# Patient Record
Sex: Male | Born: 1965 | Race: White | Hispanic: No | Smoking: Former smoker
Health system: Southern US, Community
[De-identification: ages and names within clinical notes are randomized; demographics above are authoritative.]

## PROBLEM LIST (undated history)

## (undated) DIAGNOSIS — K760 Fatty (change of) liver, not elsewhere classified: Secondary | ICD-10-CM

## (undated) DIAGNOSIS — L408 Other psoriasis: Secondary | ICD-10-CM

## (undated) DIAGNOSIS — I10 Essential (primary) hypertension: Secondary | ICD-10-CM

## (undated) DIAGNOSIS — R079 Chest pain, unspecified: Secondary | ICD-10-CM

## (undated) DIAGNOSIS — G43909 Migraine, unspecified, not intractable, without status migrainosus: Secondary | ICD-10-CM

## (undated) DIAGNOSIS — R7303 Prediabetes: Secondary | ICD-10-CM

## (undated) DIAGNOSIS — K219 Gastro-esophageal reflux disease without esophagitis: Secondary | ICD-10-CM

## (undated) DIAGNOSIS — K5792 Diverticulitis of intestine, part unspecified, without perforation or abscess without bleeding: Secondary | ICD-10-CM

## (undated) DIAGNOSIS — M545 Low back pain: Secondary | ICD-10-CM

## (undated) DIAGNOSIS — F988 Other specified behavioral and emotional disorders with onset usually occurring in childhood and adolescence: Secondary | ICD-10-CM

## (undated) DIAGNOSIS — G4730 Sleep apnea, unspecified: Secondary | ICD-10-CM

## (undated) DIAGNOSIS — E785 Hyperlipidemia, unspecified: Secondary | ICD-10-CM

## (undated) DIAGNOSIS — F32A Depression, unspecified: Secondary | ICD-10-CM

## (undated) DIAGNOSIS — M549 Dorsalgia, unspecified: Secondary | ICD-10-CM

## (undated) DIAGNOSIS — J309 Allergic rhinitis, unspecified: Secondary | ICD-10-CM

## (undated) DIAGNOSIS — F329 Major depressive disorder, single episode, unspecified: Secondary | ICD-10-CM

## (undated) HISTORY — DX: Prediabetes: R73.03

## (undated) HISTORY — DX: Depression, unspecified: F32.A

## (undated) HISTORY — DX: Dorsalgia, unspecified: M54.9

## (undated) HISTORY — DX: Migraine, unspecified, not intractable, without status migrainosus: G43.909

## (undated) HISTORY — DX: Essential (primary) hypertension: I10

## (undated) HISTORY — DX: Major depressive disorder, single episode, unspecified: F32.9

## (undated) HISTORY — DX: Gastro-esophageal reflux disease without esophagitis: K21.9

## (undated) HISTORY — DX: Fatty (change of) liver, not elsewhere classified: K76.0

## (undated) HISTORY — DX: Other psoriasis: L40.8

## (undated) HISTORY — DX: Low back pain: M54.5

## (undated) HISTORY — DX: Chest pain, unspecified: R07.9

## (undated) HISTORY — PX: CHOLECYSTECTOMY: SHX55

## (undated) HISTORY — DX: Sleep apnea, unspecified: G47.30

## (undated) HISTORY — DX: Hyperlipidemia, unspecified: E78.5

## (undated) HISTORY — DX: Allergic rhinitis, unspecified: J30.9

## (undated) HISTORY — DX: Morbid (severe) obesity due to excess calories: E66.01

## (undated) HISTORY — DX: Other specified behavioral and emotional disorders with onset usually occurring in childhood and adolescence: F98.8

---

## 1998-09-14 ENCOUNTER — Encounter: Payer: Self-pay | Admitting: Family Medicine

## 1998-09-14 ENCOUNTER — Emergency Department (HOSPITAL_COMMUNITY): Admission: EM | Admit: 1998-09-14 | Discharge: 1998-09-14 | Payer: Self-pay | Admitting: Family Medicine

## 2004-10-08 ENCOUNTER — Ambulatory Visit: Payer: Self-pay | Admitting: Internal Medicine

## 2004-10-08 LAB — CONVERTED CEMR LAB: PSA: 0.46 ng/mL

## 2004-10-15 ENCOUNTER — Ambulatory Visit: Payer: Self-pay | Admitting: Internal Medicine

## 2005-02-25 ENCOUNTER — Ambulatory Visit: Payer: Self-pay | Admitting: Internal Medicine

## 2006-11-13 ENCOUNTER — Ambulatory Visit: Payer: Self-pay | Admitting: Internal Medicine

## 2007-01-08 ENCOUNTER — Ambulatory Visit: Payer: Self-pay | Admitting: Internal Medicine

## 2007-01-08 DIAGNOSIS — M545 Low back pain, unspecified: Secondary | ICD-10-CM

## 2007-01-08 DIAGNOSIS — F329 Major depressive disorder, single episode, unspecified: Secondary | ICD-10-CM

## 2007-01-08 DIAGNOSIS — K219 Gastro-esophageal reflux disease without esophagitis: Secondary | ICD-10-CM

## 2007-01-08 DIAGNOSIS — L408 Other psoriasis: Secondary | ICD-10-CM

## 2007-01-08 DIAGNOSIS — J309 Allergic rhinitis, unspecified: Secondary | ICD-10-CM

## 2007-01-08 DIAGNOSIS — E785 Hyperlipidemia, unspecified: Secondary | ICD-10-CM

## 2007-01-08 DIAGNOSIS — G43909 Migraine, unspecified, not intractable, without status migrainosus: Secondary | ICD-10-CM

## 2007-01-08 DIAGNOSIS — F3289 Other specified depressive episodes: Secondary | ICD-10-CM

## 2007-01-08 HISTORY — DX: Low back pain, unspecified: M54.50

## 2007-01-08 HISTORY — DX: Major depressive disorder, single episode, unspecified: F32.9

## 2007-01-08 HISTORY — DX: Allergic rhinitis, unspecified: J30.9

## 2007-01-08 HISTORY — DX: Hyperlipidemia, unspecified: E78.5

## 2007-01-08 HISTORY — DX: Other specified depressive episodes: F32.89

## 2007-01-08 HISTORY — DX: Gastro-esophageal reflux disease without esophagitis: K21.9

## 2007-01-08 HISTORY — DX: Other psoriasis: L40.8

## 2007-01-08 HISTORY — DX: Migraine, unspecified, not intractable, without status migrainosus: G43.909

## 2007-01-08 HISTORY — DX: Morbid (severe) obesity due to excess calories: E66.01

## 2007-02-21 ENCOUNTER — Encounter: Payer: Self-pay | Admitting: Internal Medicine

## 2007-02-21 ENCOUNTER — Ambulatory Visit: Payer: Self-pay | Admitting: Internal Medicine

## 2007-10-18 ENCOUNTER — Ambulatory Visit: Payer: Self-pay | Admitting: Internal Medicine

## 2007-10-18 DIAGNOSIS — M549 Dorsalgia, unspecified: Secondary | ICD-10-CM

## 2007-10-18 HISTORY — DX: Dorsalgia, unspecified: M54.9

## 2007-11-20 ENCOUNTER — Ambulatory Visit: Payer: Self-pay | Admitting: Internal Medicine

## 2008-04-08 ENCOUNTER — Encounter: Payer: Self-pay | Admitting: Internal Medicine

## 2008-04-09 ENCOUNTER — Encounter: Payer: Self-pay | Admitting: Internal Medicine

## 2008-04-16 ENCOUNTER — Ambulatory Visit: Payer: Self-pay | Admitting: Internal Medicine

## 2008-04-16 DIAGNOSIS — R079 Chest pain, unspecified: Secondary | ICD-10-CM

## 2008-04-16 HISTORY — DX: Chest pain, unspecified: R07.9

## 2008-04-21 ENCOUNTER — Encounter: Payer: Self-pay | Admitting: Internal Medicine

## 2008-05-06 ENCOUNTER — Encounter: Payer: Self-pay | Admitting: Internal Medicine

## 2008-09-01 ENCOUNTER — Ambulatory Visit: Payer: Self-pay | Admitting: Diagnostic Radiology

## 2008-09-01 ENCOUNTER — Emergency Department (HOSPITAL_BASED_OUTPATIENT_CLINIC_OR_DEPARTMENT_OTHER): Admission: EM | Admit: 2008-09-01 | Discharge: 2008-09-02 | Payer: Self-pay | Admitting: Emergency Medicine

## 2011-02-13 ENCOUNTER — Encounter: Payer: Self-pay | Admitting: Internal Medicine

## 2011-02-13 DIAGNOSIS — Z0000 Encounter for general adult medical examination without abnormal findings: Secondary | ICD-10-CM | POA: Insufficient documentation

## 2011-02-18 ENCOUNTER — Encounter: Payer: Self-pay | Admitting: Internal Medicine

## 2011-02-18 ENCOUNTER — Ambulatory Visit (INDEPENDENT_AMBULATORY_CARE_PROVIDER_SITE_OTHER): Payer: BC Managed Care – PPO | Admitting: Internal Medicine

## 2011-02-18 VITALS — BP 104/70 | HR 68 | Temp 98.9°F | Ht 68.0 in | Wt 233.8 lb

## 2011-02-18 DIAGNOSIS — K219 Gastro-esophageal reflux disease without esophagitis: Secondary | ICD-10-CM

## 2011-02-18 DIAGNOSIS — J309 Allergic rhinitis, unspecified: Secondary | ICD-10-CM

## 2011-02-18 DIAGNOSIS — Z23 Encounter for immunization: Secondary | ICD-10-CM

## 2011-02-18 DIAGNOSIS — H9311 Tinnitus, right ear: Secondary | ICD-10-CM | POA: Insufficient documentation

## 2011-02-18 DIAGNOSIS — Z0000 Encounter for general adult medical examination without abnormal findings: Secondary | ICD-10-CM

## 2011-02-18 DIAGNOSIS — F329 Major depressive disorder, single episode, unspecified: Secondary | ICD-10-CM

## 2011-02-18 DIAGNOSIS — H9319 Tinnitus, unspecified ear: Secondary | ICD-10-CM

## 2011-02-18 MED ORDER — FLUTICASONE PROPIONATE 50 MCG/ACT NA SUSP: 2.0000 | Freq: Every day | NASAL | Status: DC

## 2011-02-18 MED ORDER — FEXOFENADINE HCL 180 MG PO TABS: 180.0000 mg | ORAL_TABLET | Freq: Every day | ORAL | Status: DC

## 2011-02-18 NOTE — Progress Notes (Signed)
  Subjective:    Patient ID: Terry Warren, male    DOB: 10-30-1965, 45 y.o.   MRN: [id number]  HPI  Here to c/o new onset ringing to right ear since sept 15 only in [place]virons with loud noises occuring, better with cotton in the ear to prevent sound exposure.  Does have several wks ongoing nasal allergy symptoms with clear congestion, itch and sneeze, without fever, pain, ST, cough or wheezing.  Requested Tdap due to recent vacation an small cut from oyster shells, now resolved. Pt denies chest pain, increased sob or doe, wheezing, orthopnea, PND, increased LE swelling, palpitations, dizziness or syncope.  Pt denies new neurological symptoms such as new headache, or facial or extremity weakness or numbness   Pt denies polydipsia, polyuria,  Pt denies fever, wt loss, night sweats, loss of appetite, or other constitutional symptoms  Does not feel "ill"  Denies worsening reflux, dysphagia, abd pain, n/v, bowel change or blood.  Denies worsening depressive symptoms, suicidal ideation, or panic, though has ongoing anxiety  Past Medical History  Diagnosis Date  . ALLERGIC RHINITIS 01/08/2007  . BACK PAIN 10/18/2007  . CHEST PAIN 04/16/2008  . DEPRESSION 01/08/2007  . GERD 01/08/2007  . HYPERLIPIDEMIA 01/08/2007  . LOW BACK PAIN 01/08/2007  . MIGRAINE HEADACHE 01/08/2007  . OBESITY, MORBID 01/08/2007  . PSORIASIS 01/08/2007   No past surgical history on file.  reports that he has never smoked. He does not have any smokeless tobacco history on file. He reports that he does not drink alcohol or use illicit drugs. family history includes Cancer in his other. Allergies  Allergen Reactions  . Penicillins    Current Outpatient Prescriptions on File Prior to Visit  Medication Sig Dispense Refill  . esomeprazole (NEXIUM) 40 MG capsule Take 40 mg by mouth daily.         Review of Systems Review of Systems  Constitutional: Negative for diaphoresis and unexpected weight change.  HENT: Negative for drooling and  tinnitus.   Eyes: Negative for photophobia and visual disturbance.  Respiratory: Negative for choking and stridor.   Gastrointestinal: Negative for vomiting and blood in stool.  Genitourinary: Negative for hematuria and decreased urine volume.       Objective:   Physical Exam BP 104/70  Pulse 68  Temp(Src) 98.9 F (37.2 C) (Oral)  Ht 5\' 8"  (1.727 m)  Wt 233 lb 12 oz (106.028 kg)  BMI 35.54 kg/m2  SpO2 96% Physical Exam  VS noted, not ill appearing Constitutional: Pt appears well-developed and well-nourished.  HENT: Head: Normocephalic.  Right Ear: External ear normal.  Left Ear: External ear normal. Bilat tm's mild erythema.  Sinus nontender.  Pharynx mild erythema  Eyes: Conjunctivae and EOM are normal. Pupils are equal, round, and reactive to light.  Neck: Normal range of motion. Neck supple.  Cardiovascular: Normal rate and regular rhythm.   Pulmonary/Chest: Effort normal and breath sounds normal. \ Abd: soft, +BS, nondistended, no HSM, NT Neurological: Pt is alert. No cranial nerve deficit. motor/sens/dtr/gait intact Skin: Skin is warm. No erythema. no rash Psychiatric: Pt behavior is normal. Thought content normal. 1+ nervous, not depressed appaering        Assessment & Plan:

## 2011-02-18 NOTE — Patient Instructions (Addendum)
Take all new medications as prescribed Continue all other medications as before You had the tetanus shot today Please return in 1 year for your yearly visit, or sooner if needed, with Lab testing done 3-5 days before

## 2011-02-19 ENCOUNTER — Encounter: Payer: Self-pay | Admitting: Internal Medicine

## 2011-02-19 NOTE — Assessment & Plan Note (Signed)
stable overall by hx and exam, most recent data reviewed with pt, and pt to continue medical treatment as before  Lab Results  Component Value Date   PSA 0.46 10/08/2004

## 2011-02-19 NOTE — Assessment & Plan Note (Signed)
Mild to mod, for allegra/flonase asd,  to f/u any worsening symptoms or concerns  

## 2011-02-19 NOTE — Assessment & Plan Note (Signed)
Most likely related to recent allergy flare, declines depomedrol today, also for mucinex otc prn,  to f/u any worsening symptoms or concerns, consider ENT

## 2011-02-19 NOTE — Assessment & Plan Note (Signed)
Improved and stable, now controlled with diet and zantac 75 mg daily,  to f/u any worsening symptoms or concerns

## 2011-06-28 ENCOUNTER — Encounter: Payer: Self-pay | Admitting: *Deleted

## 2011-06-28 ENCOUNTER — Emergency Department
Admission: EM | Admit: 2011-06-28 | Discharge: 2011-06-28 | Disposition: A | Discharge status: Home / Self Care | Payer: BC Managed Care – PPO | Source: Home / Self Care | Attending: Emergency Medicine | Admitting: Emergency Medicine

## 2011-06-28 DIAGNOSIS — J069 Acute upper respiratory infection, unspecified: Secondary | ICD-10-CM

## 2011-06-28 MED ORDER — AZITHROMYCIN 250 MG PO TABS: ORAL_TABLET | ORAL | Status: AC

## 2011-06-28 NOTE — ED Provider Notes (Signed)
History     CSN: [id number]  Arrival date & time 06/28/11  1253   First MD Initiated Contact with Patient 06/28/11 1311      No chief complaint on file.   (Consider location/radiation/quality/duration/timing/severity/associated sxs/prior treatment) HPI Terry Warren is a 46 y.o. male who complains of onset of cold symptoms for 3 days.  No sore throat + cough No pleuritic pain No wheezing + nasal congestion +post-nasal drainage + sinus pain/pressure No chest congestion No itchy/red eyes No earache No hemoptysis No SOB No chills/sweats No fever + nausea No vomiting No abdominal pain No diarrhea No skin rashes No fatigue No myalgias + headache    Past Medical History  Diagnosis Date  . ALLERGIC RHINITIS 01/08/2007  . BACK PAIN 10/18/2007  . CHEST PAIN 04/16/2008  . DEPRESSION 01/08/2007  . GERD 01/08/2007  . HYPERLIPIDEMIA 01/08/2007  . LOW BACK PAIN 01/08/2007  . MIGRAINE HEADACHE 01/08/2007  . OBESITY, MORBID 01/08/2007  . PSORIASIS 01/08/2007    No past surgical history on file.  Family History  Problem Relation Age of Onset  . Cancer Other     breast cancer    History  Substance Use Topics  . Smoking status: Never Smoker   . Smokeless tobacco: Not on file  . Alcohol Use: No      Review of Systems  Allergies  Penicillins  Home Medications   Current Outpatient Rx  Name Route Sig Dispense Refill  . FEXOFENADINE HCL 180 MG PO TABS Oral Take 1 tablet (180 mg total) by mouth daily. 30 tablet 2  . FLUTICASONE PROPIONATE 50 MCG/ACT NA SUSP Nasal Place 2 sprays into the nose daily. 16 g 2  . RANITIDINE HCL 25 MG PO TBEF Oral Take 25 mg by mouth daily.        There were no vitals taken for this visit.  Physical Exam  Nursing note and vitals reviewed. Constitutional: He is oriented to person, place, and time. He appears well-developed and well-nourished.  HENT:  Head: Normocephalic and atraumatic.  Right Ear: Tympanic membrane, external ear and ear canal  normal.  Left Ear: Tympanic membrane, external ear and ear canal normal.  Nose: Mucosal edema and rhinorrhea present.  Mouth/Throat: Posterior oropharyngeal erythema present. No oropharyngeal exudate or posterior oropharyngeal edema.  Eyes: No scleral icterus.  Neck: Neck supple.  Cardiovascular: Regular rhythm and normal heart sounds.   Pulmonary/Chest: Effort normal and breath sounds normal. No respiratory distress.  Neurological: He is alert and oriented to person, place, and time.  Skin: Skin is warm and dry.  Psychiatric: He has a normal mood and affect. His speech is normal.    ED Course  Procedures (including critical care time)  Labs Reviewed - No data to display No results found.   No diagnosis found.    MDM  1)  Take the prescribed antibiotic as instructed. 2)  Use nasal saline solution (over the counter) at least 3 times a day. 3)  Use over the counter decongestants like Zyrtec-D every 12 hours as needed to help with congestion.  If you have hypertension, do not take medicines with sudafed.  4)  Can take tylenol every 6 hours or motrin every 8 hours for pain or fever. 5)  Follow up with your primary doctor if no improvement in 5-7 days, sooner if increasing pain, fever, or new symptoms.     Lily Kocher, MD 06/28/11 [phone]

## 2011-06-28 NOTE — ED Notes (Signed)
Pt c/o sinus pressure, HA, nausea, and chills x 2 days.

## 2012-02-15 ENCOUNTER — Encounter: Payer: Self-pay | Admitting: *Deleted

## 2012-02-15 ENCOUNTER — Emergency Department
Admission: EM | Admit: 2012-02-15 | Discharge: 2012-02-15 | Disposition: A | Discharge status: Home / Self Care | Payer: BC Managed Care – PPO | Source: Home / Self Care

## 2012-02-15 DIAGNOSIS — H9319 Tinnitus, unspecified ear: Secondary | ICD-10-CM

## 2012-02-15 DIAGNOSIS — H9311 Tinnitus, right ear: Secondary | ICD-10-CM

## 2012-02-15 DIAGNOSIS — J309 Allergic rhinitis, unspecified: Secondary | ICD-10-CM

## 2012-02-15 MED ORDER — FEXOFENADINE HCL 180 MG PO TABS: 180.0000 mg | ORAL_TABLET | Freq: Every day | ORAL | Status: DC

## 2012-02-15 MED ORDER — FLUTICASONE PROPIONATE 50 MCG/ACT NA SUSP: 2.0000 | Freq: Every day | NASAL | Status: DC

## 2012-02-15 NOTE — ED Notes (Signed)
Pt c/o RT ear ache and tinnitus x 6 days. No OTC meds. Denies fever.

## 2012-02-15 NOTE — ED Provider Notes (Signed)
History     CSN: [id number]  Arrival date & time 02/15/12  1215   First MD Initiated Contact with Patient 02/15/12 1221      Chief Complaint  Patient presents with  . Otalgia   HPI L ear irritation x 1-2 weeks. Pt states that he initially noticed irritation during/after  football game. Has had intermittent ear irritation and ringing since this point. Pt states that he went [place] the [employer] game over the weekend and ear irritation and pain flared again. Pt denies any decreased hearing, ear pain, nausea, vomiting. No recent infections. Pt states that he had a similar episode last year associated with going to football game. Pt states that he was placed on flonase and allegra and sxs resolved.  Pt does report hx/o allergic rhinitis, though sxs have been mild.   Past Medical History  Diagnosis Date  . ALLERGIC RHINITIS 01/08/2007  . BACK PAIN 10/18/2007  . CHEST PAIN 04/16/2008  . DEPRESSION 01/08/2007  . GERD 01/08/2007  . HYPERLIPIDEMIA 01/08/2007  . LOW BACK PAIN 01/08/2007  . MIGRAINE HEADACHE 01/08/2007  . OBESITY, MORBID 01/08/2007  . PSORIASIS 01/08/2007    Past Surgical History  Procedure Date  . Cholecystectomy     Family History  Problem Relation Age of Onset  . Cancer Other     breast cancer  . Heart failure Mother   . Cancer Mother     breast  . Cancer Father     prostate  . Heart failure Father     History  Substance Use Topics  . Smoking status: Former Games developer  . Smokeless tobacco: Not on file  . Alcohol Use: No      Review of Systems  All other systems reviewed and are negative.    Allergies  Penicillins  Home Medications   Current Outpatient Rx  Name Route Sig Dispense Refill  . FEXOFENADINE HCL 180 MG PO TABS Oral Take 1 tablet (180 mg total) by mouth daily. 30 tablet 2  . FLUTICASONE PROPIONATE 50 MCG/ACT NA SUSP Nasal Place 2 sprays into the nose daily. 16 g 2  . RANITIDINE HCL 25 MG PO TBEF Oral Take 25 mg by mouth daily.        BP  127/89  Pulse 81  Temp 98.5 F (36.9 C) (Oral)  Resp 18  Ht 5\' 8"  (1.727 m)  Wt 242 lb (109.77 kg)  BMI 36.80 kg/m2  SpO2 97%  Physical Exam  Constitutional: He appears well-developed and well-nourished.  HENT:  Head: Normocephalic and atraumatic.  Right Ear: External ear normal.  Left Ear: External ear normal.       +mild nasal erythema, rhinorrhea bilaterally, + post oropharyngeal erythema    Eyes: Conjunctivae normal are normal. Pupils are equal, round, and reactive to light.  Neck: Normal range of motion. Neck supple.  Cardiovascular: Normal rate, regular rhythm and normal heart sounds.   Pulmonary/Chest: Effort normal and breath sounds normal. He has no wheezes.  Abdominal: Soft. Bowel sounds are normal.  Musculoskeletal: Normal range of motion.  Lymphadenopathy:    He has no cervical adenopathy.  Neurological: He is alert.  Skin: Skin is warm.    ED Course  Procedures (including critical care time)  Labs Reviewed - No data to display No results found.   1. Allergic rhinitis, cause unspecified   2. Tinnitus of right ear       MDM  Will place back on flonase and allegra.  Discussed general symptomatic management.  No infectious/neuro red flags currently.  Discussed follow up with ENT if this persists.     The patient and/or caregiver has been counseled thoroughly with regard to treatment plan and/or medications prescribed including dosage, schedule, interactions, rationale for use, and possible side effects and they verbalize understanding. Diagnoses and expected course of recovery discussed and will return if not improved as expected or if the condition worsens. Patient and/or caregiver verbalized understanding.             Doree Albee, MD 02/15/12 1343

## 2012-02-20 ENCOUNTER — Ambulatory Visit (HOSPITAL_BASED_OUTPATIENT_CLINIC_OR_DEPARTMENT_OTHER)
Admission: RE | Admit: 2012-02-20 | Discharge: 2012-02-20 | Disposition: A | Discharge status: Home / Self Care | Payer: BC Managed Care – PPO | Source: Ambulatory Visit | Attending: Family Medicine | Admitting: Family Medicine

## 2012-02-20 ENCOUNTER — Encounter: Payer: Self-pay | Admitting: *Deleted

## 2012-02-20 ENCOUNTER — Emergency Department
Admission: EM | Admit: 2012-02-20 | Discharge: 2012-02-20 | Disposition: A | Discharge status: Home / Self Care | Payer: BC Managed Care – PPO | Source: Home / Self Care

## 2012-02-20 ENCOUNTER — Telehealth: Payer: Self-pay | Admitting: *Deleted

## 2012-02-20 DIAGNOSIS — R1032 Left lower quadrant pain: Secondary | ICD-10-CM | POA: Insufficient documentation

## 2012-02-20 DIAGNOSIS — K5732 Diverticulitis of large intestine without perforation or abscess without bleeding: Secondary | ICD-10-CM | POA: Insufficient documentation

## 2012-02-20 DIAGNOSIS — K7689 Other specified diseases of liver: Secondary | ICD-10-CM | POA: Insufficient documentation

## 2012-02-20 DIAGNOSIS — K5730 Diverticulosis of large intestine without perforation or abscess without bleeding: Secondary | ICD-10-CM | POA: Insufficient documentation

## 2012-02-20 DIAGNOSIS — K4090 Unilateral inguinal hernia, without obstruction or gangrene, not specified as recurrent: Secondary | ICD-10-CM | POA: Insufficient documentation

## 2012-02-20 DIAGNOSIS — K5792 Diverticulitis of intestine, part unspecified, without perforation or abscess without bleeding: Secondary | ICD-10-CM

## 2012-02-20 DIAGNOSIS — K5900 Constipation, unspecified: Secondary | ICD-10-CM | POA: Insufficient documentation

## 2012-02-20 LAB — POCT CBC W AUTO DIFF (K'VILLE URGENT CARE)

## 2012-02-20 MED ORDER — IOHEXOL 300 MG/ML  SOLN
100.0000 mL | Freq: Once | INTRAMUSCULAR | Status: AC | PRN
  Administered 2012-02-20: 100 mL via INTRAVENOUS

## 2012-02-20 MED ORDER — CIPROFLOXACIN HCL 500 MG PO TABS: 500.0000 mg | ORAL_TABLET | Freq: Two times a day (BID) | ORAL | Status: DC

## 2012-02-20 MED ORDER — METRONIDAZOLE 500 MG PO TABS: 500.0000 mg | ORAL_TABLET | Freq: Three times a day (TID) | ORAL | Status: DC

## 2012-02-20 NOTE — ED Notes (Signed)
Pt c/o lower abdominal and HA x this morning. No OTC meds. Denies fever.

## 2012-02-20 NOTE — ED Provider Notes (Signed)
History     CSN: [id number]  Arrival date & time 02/20/12  1225   First MD Initiated Contact with Patient 02/20/12 1226      Chief Complaint  Patient presents with  . Abdominal Pain  . Headache   HPI Comments: LLQ abd pain x 1 day Pt woke up this am with abd pain that persisted with eating.  Has also had some generalized malaise.  No fevers or chills.  Pt has hx/o diverticulitis. Last flare was several years ago.  Pain seems to be progressively worsening.   Patient is a 46 y.o. male presenting with abdominal pain and headaches.  Abdominal Pain The primary symptoms of the illness include abdominal pain. Episode onset: earlier today  The onset of the illness was gradual. The problem has been gradually worsening.  The illness is associated with eating. The patient states that she believes she is currently not pregnant. The patient has not had a change in bowel habit (pt denies eating seeds or nuts ). Significant associated medical issues include diverticulitis.  Headache The primary symptoms include headaches.    Past Medical History  Diagnosis Date  . ALLERGIC RHINITIS 01/08/2007  . BACK PAIN 10/18/2007  . CHEST PAIN 04/16/2008  . DEPRESSION 01/08/2007  . GERD 01/08/2007  . HYPERLIPIDEMIA 01/08/2007  . LOW BACK PAIN 01/08/2007  . MIGRAINE HEADACHE 01/08/2007  . OBESITY, MORBID 01/08/2007  . PSORIASIS 01/08/2007    Past Surgical History  Procedure Date  . Cholecystectomy     Family History  Problem Relation Age of Onset  . Cancer Other     breast cancer  . Heart failure Mother   . Cancer Mother     breast  . Cancer Father     prostate  . Heart failure Father     History  Substance Use Topics  . Smoking status: Former Games developer  . Smokeless tobacco: Not on file  . Alcohol Use: No      Review of Systems  Gastrointestinal: Positive for abdominal pain.  Neurological: Positive for headaches.  All other systems reviewed and are negative.    Allergies    Penicillins  Home Medications   Current Outpatient Rx  Name Route Sig Dispense Refill  . FEXOFENADINE HCL 180 MG PO TABS Oral Take 1 tablet (180 mg total) by mouth daily. 30 tablet 2  . FLUTICASONE PROPIONATE 50 MCG/ACT NA SUSP Nasal Place 2 sprays into the nose daily. 16 g 2  . RANITIDINE HCL 25 MG PO TBEF Oral Take 25 mg by mouth daily.        BP 123/84  Pulse 92  Temp 98.7 F (37.1 C) (Oral)  Resp 18  Ht 5\' 8"  (1.727 m)  Wt 244 lb (110.678 kg)  BMI 37.10 kg/m2  SpO2 98%  Physical Exam  Constitutional: He is oriented to person, place, and time. He appears well-developed and well-nourished.  HENT:  Head: Normocephalic and atraumatic.  Eyes: Conjunctivae normal are normal. Pupils are equal, round, and reactive to light.  Neck: Normal range of motion. Neck supple.  Cardiovascular: Normal rate and regular rhythm.   Pulmonary/Chest: Effort normal and breath sounds normal.  Abdominal: Soft. Bowel sounds are normal.       + TTP LLQ + L flank TTP  Musculoskeletal: Normal range of motion.  Neurological: He is alert and oriented to person, place, and time.  Skin: Skin is warm.    ED Course  Procedures (including critical care time)   Labs Reviewed  POCT CBC W AUTO DIFF ([facility])   No results found.   1. Diverticulitis       MDM  Sxs consistent with diverticulitis flare.  Noted WBC 11.8 today.  Will obtain CT Abd and Pelvis to better assess extent of flare.  Cipro and flagyl for infecitous coverage.  Clear liquid diet.  Rest.  Discussed general red flags that would warrant urgent evaluation including worsening abdominal pain and fever.  Follow up in 5-7 days with PCP.    The patient and/or caregiver has been counseled thoroughly with regard to treatment plan and/or medications prescribed including dosage, schedule, interactions, rationale for use, and possible side effects and they verbalize understanding. Diagnoses and expected course of recovery  discussed and will return if not improved as expected or if the condition worsens. Patient and/or caregiver verbalized understanding.              Doree Albee, MD 02/20/12 [phone]

## 2012-02-22 ENCOUNTER — Telehealth: Payer: Self-pay | Admitting: *Deleted

## 2012-04-05 ENCOUNTER — Emergency Department
Admission: EM | Admit: 2012-04-05 | Discharge: 2012-04-05 | Disposition: A | Discharge status: Home / Self Care | Payer: BC Managed Care – PPO | Source: Home / Self Care | Attending: Emergency Medicine | Admitting: Emergency Medicine

## 2012-04-05 ENCOUNTER — Encounter: Payer: Self-pay | Admitting: *Deleted

## 2012-04-05 DIAGNOSIS — H109 Unspecified conjunctivitis: Secondary | ICD-10-CM

## 2012-04-05 HISTORY — DX: Diverticulitis of intestine, part unspecified, without perforation or abscess without bleeding: K57.92

## 2012-04-05 MED ORDER — POLYMYXIN B-TRIMETHOPRIM 10000-0.1 UNIT/ML-% OP SOLN: 1.0000 [drp] | Freq: Four times a day (QID) | OPHTHALMIC | Status: DC

## 2012-04-05 NOTE — ED Notes (Signed)
Pt c/o LT eye redness and green drainage x today.

## 2012-04-05 NOTE — ED Provider Notes (Signed)
History     CSN: [id number]  Arrival date & time 04/05/12  4098   First MD Initiated Contact with Patient 04/05/12 1901      Chief Complaint  Patient presents with  . Eye Problem    (Consider location/radiation/quality/duration/timing/severity/associated sxs/prior treatment) HPI Terry Warren presents today with an EYE COMPLAINT.  He is a Systems developer.  Location: left  Onset: 1  Days   Symptoms: Redness: yes Discharge: yes Pain: no Photophobia: no Decreased Vision: no URI symptoms: yes Itching/Allergy sxs: no Glaucoma: no Recent eye surgery: no Contact lens use: no  Red Flags Trauma: no Foreign Body: no Vomiting/HA: no Halos around lights: no Chickenpox or zoster: no    Past Medical History  Diagnosis Date  . ALLERGIC RHINITIS 01/08/2007  . BACK PAIN 10/18/2007  . CHEST PAIN 04/16/2008  . DEPRESSION 01/08/2007  . GERD 01/08/2007  . HYPERLIPIDEMIA 01/08/2007  . LOW BACK PAIN 01/08/2007  . MIGRAINE HEADACHE 01/08/2007  . OBESITY, MORBID 01/08/2007  . PSORIASIS 01/08/2007  . Diverticulitis     Past Surgical History  Procedure Date  . Cholecystectomy     Family History  Problem Relation Age of Onset  . Cancer Other     breast cancer  . Heart failure Mother   . Cancer Mother     breast  . Cancer Father     prostate  . Heart failure Father     History  Substance Use Topics  . Smoking status: Former Games developer  . Smokeless tobacco: Not on file  . Alcohol Use: No      Review of Systems  All other systems reviewed and are negative.    Allergies  Penicillins  Home Medications   Current Outpatient Rx  Name  Route  Sig  Dispense  Refill  . GUAIFENESIN ER 600 MG PO TB12   Oral   Take 1,200 mg by mouth 2 (two) times daily.         Marland Kitchen CIPROFLOXACIN HCL 500 MG PO TABS   Oral   Take 1 tablet (500 mg total) by mouth 2 (two) times daily.   14 tablet   0   . FEXOFENADINE HCL 180 MG PO TABS   Oral   Take 1 tablet (180 mg total) by mouth daily.   30 tablet   2   . FLUTICASONE PROPIONATE 50 MCG/ACT NA SUSP   Nasal   Place 2 sprays into the nose daily.   16 g   2   . METRONIDAZOLE 500 MG PO TABS   Oral   Take 1 tablet (500 mg total) by mouth 3 (three) times daily.   21 tablet   0   . RANITIDINE HCL 25 MG PO TBEF   Oral   Take 25 mg by mouth daily.           Marland Kitchen POLYMYXIN B-TRIMETHOPRIM 10000-0.1 UNIT/ML-% OP SOLN   Left Eye   Place 1 drop into the left eye every 6 (six) hours.   10 mL   0     BP 122/83  Pulse 90  Temp 98.5 F (36.9 C) (Oral)  Resp 18  Ht 5\' 8"  (1.727 m)  Wt 242 lb (109.77 kg)  BMI 36.80 kg/m2  SpO2 97%  Physical Exam  Nursing note and vitals reviewed. Constitutional: He is oriented to person, place, and time. He appears well-developed and well-nourished.  HENT:  Head: Normocephalic and atraumatic.  Eyes: EOM are normal. Pupils are equal, round, and reactive to  light. Left eye exhibits discharge. No foreign body present in the left eye. Left conjunctiva is injected. No scleral icterus.  Neck: Neck supple.  Cardiovascular: Regular rhythm and normal heart sounds.   Pulmonary/Chest: Effort normal and breath sounds normal. No respiratory distress.  Neurological: He is alert and oriented to person, place, and time.  Skin: Skin is warm and dry.  Psychiatric: He has a normal mood and affect. His speech is normal.    ED Course  Procedures (including critical care time)  Labs Reviewed - No data to display No results found.   1. Conjunctivitis       MDM   I advised to use the eyedrops as directed.  Where his glasses normally.  Good handwashing and hand hygiene.  This is likely a mild conjunctivitis, likely her old.  Cannot rule out a very small corneal abrasion and did not do a foreseen examination today but he would be very mild if anything.  If worsening or new symptoms, followup with PCP or ophthalmology.     Marlaine Hind, MD 04/05/12 [phone]

## 2013-03-27 ENCOUNTER — Encounter: Payer: Self-pay | Admitting: Internal Medicine

## 2013-03-27 ENCOUNTER — Ambulatory Visit (INDEPENDENT_AMBULATORY_CARE_PROVIDER_SITE_OTHER): Payer: BC Managed Care – PPO | Admitting: Internal Medicine

## 2013-03-27 ENCOUNTER — Other Ambulatory Visit (INDEPENDENT_AMBULATORY_CARE_PROVIDER_SITE_OTHER): Payer: BC Managed Care – PPO

## 2013-03-27 VITALS — BP 114/80 | HR 88 | Temp 99.0°F | Ht 68.0 in | Wt 237.0 lb

## 2013-03-27 DIAGNOSIS — R1013 Epigastric pain: Secondary | ICD-10-CM

## 2013-03-27 DIAGNOSIS — Z0000 Encounter for general adult medical examination without abnormal findings: Secondary | ICD-10-CM

## 2013-03-27 DIAGNOSIS — K219 Gastro-esophageal reflux disease without esophagitis: Secondary | ICD-10-CM

## 2013-03-27 LAB — HEPATIC FUNCTION PANEL
AST: 17 U/L (ref 0–37)
Alkaline Phosphatase: 69 U/L (ref 39–117)
Bilirubin, Direct: 0.1 mg/dL (ref 0.0–0.3)
Total Bilirubin: 0.4 mg/dL (ref 0.3–1.2)

## 2013-03-27 LAB — CBC WITH DIFFERENTIAL/PLATELET
Basophils Absolute: 0 10*3/uL (ref 0.0–0.1)
HCT: 42.4 % (ref 39.0–52.0)
Hemoglobin: 14.5 g/dL (ref 13.0–17.0)
Lymphs Abs: 2.1 10*3/uL (ref 0.7–4.0)
MCV: 86.9 fl (ref 78.0–100.0)
Monocytes Absolute: 0.5 10*3/uL (ref 0.1–1.0)
Neutro Abs: 3.8 10*3/uL (ref 1.4–7.7)
Platelets: 271 10*3/uL (ref 150.0–400.0)
RDW: 13.5 % (ref 11.5–14.6)

## 2013-03-27 LAB — URINALYSIS, ROUTINE W REFLEX MICROSCOPIC
Bilirubin Urine: NEGATIVE
Ketones, ur: NEGATIVE
Leukocytes, UA: NEGATIVE
RBC / HPF: NONE SEEN (ref 0–?)
Urine Glucose: NEGATIVE
Urobilinogen, UA: 0.2 (ref 0.0–1.0)
pH: 5.5 (ref 5.0–8.0)

## 2013-03-27 LAB — BASIC METABOLIC PANEL
GFR: 76.03 mL/min (ref >60.00)
Potassium: 4.6 mEq/L (ref 3.5–5.1)
Sodium: 140 mEq/L (ref 135–145)

## 2013-03-27 LAB — TSH: TSH: 3.67 u[IU]/mL (ref 0.35–5.50)

## 2013-03-27 LAB — LIPID PANEL
LDL Cholesterol: 109 mg/dL — ABNORMAL HIGH (ref 0–99)
Total CHOL/HDL Ratio: 4
VLDL: 15.6 mg/dL (ref 0.0–40.0)

## 2013-03-27 LAB — H. PYLORI ANTIBODY, IGG: H Pylori IgG: NEGATIVE

## 2013-03-27 MED ORDER — PANTOPRAZOLE SODIUM 40 MG PO TBEC: 40.0000 mg | DELAYED_RELEASE_TABLET | Freq: Every day | ORAL | Status: DC

## 2013-03-27 NOTE — Assessment & Plan Note (Addendum)

## 2013-03-27 NOTE — Patient Instructions (Signed)
Please take all new medication as prescribed - the generic protonix Please continue all other medications as before Please have the pharmacy call with any other refills you may need. Please continue your efforts at being more active, low cholesterol diet, and weight control. You are otherwise up to date with prevention measures today. Please keep your appointments with your specialists as you may have planned  Please go to the LAB in the Basement (turn left off the elevator) for the tests to be done today You will be contacted by phone if any changes need to be made immediately.  Otherwise, you will receive a letter about your results with an explanation, but please check with MyChart first.  Please return in 1 year for your yearly visit, or sooner if needed, with Lab testing done 3-5 days before

## 2013-03-27 NOTE — Assessment & Plan Note (Signed)
For protonix asd,  to f/u any worsening symptoms or concerns  

## 2013-03-27 NOTE — Progress Notes (Signed)
Pre-visit discussion using our clinic review tool. No additional management support is needed unless otherwise documented below in the visit note.  

## 2013-03-27 NOTE — Assessment & Plan Note (Signed)
?   Gastritis vs other, for h pylori

## 2013-03-27 NOTE — Progress Notes (Signed)
Subjective:    Patient ID: Terry Warren, male    DOB: [birth year], 47 y.o.   MRN: [id number]  HPI  Here for wellness and f/u;  Overall doing ok;  Pt denies CP, worsening SOB, DOE, wheezing, orthopnea, PND, worsening LE edema, palpitations, dizziness or syncope.  Pt denies neurological change such as new headache, facial or extremity weakness.  Pt denies polydipsia, polyuria, or low sugar symptoms. Pt states overall good compliance with treatment and medications, good tolerability, and has been trying to follow lower cholesterol diet.  Pt denies worsening depressive symptoms, suicidal ideation or panic. No fever, night sweats, wt loss, loss of appetite, or other constitutional symptoms.  Pt states good ability with ADL's, has low fall risk, home safety reviewed and adequate, no other significant changes in hearing or vision, and only occasionally active with exercise. Also - C/o 2wks intermittent mild lower mid sternal discomfort sharp, feels constricting like at times, no radiation, no sob, palps, diaphoresis, dizziness.  Usually lasts 10-15 min, not tried antacid, nonexertional, nonpleuritic.  Denies dysphagia, n/v, bowel change or blood. Has hx of surgury for diverticulitis June 2014, followed per Dr Thedore Mins - colorectal surgury;  in [place]  Has had episodes of sour brash with vomiting rare, about 2-3 times per yr, clearly related to spicy foods.  Had flu shot at work last wk. Past Medical History  Diagnosis Date  . ALLERGIC RHINITIS 01/08/2007  . BACK PAIN 10/18/2007  . CHEST PAIN 04/16/2008  . DEPRESSION 01/08/2007  . GERD 01/08/2007  . HYPERLIPIDEMIA 01/08/2007  . LOW BACK PAIN 01/08/2007  . MIGRAINE HEADACHE 01/08/2007  . OBESITY, MORBID 01/08/2007  . PSORIASIS 01/08/2007  . Diverticulitis    Past Surgical History  Procedure Laterality Date  . Cholecystectomy      reports that he has quit smoking. He does not have any smokeless tobacco history on file. He reports that he does not drink  alcohol or use illicit drugs. family history includes Cancer in his father, mother, and other; Heart failure in his father and mother. Allergies  Allergen Reactions  . Penicillins    Current Outpatient Prescriptions on File Prior to Visit  Medication Sig Dispense Refill  . ranitidine (ZANTAC) 25 MG effervescent tablet Take 25 mg by mouth daily.         No current facility-administered medications on file prior to visit.   Review of Systems Constitutional: Negative for diaphoresis, activity change, appetite change or unexpected weight change.  HENT: Negative for hearing loss, ear pain, facial swelling, mouth sores and neck stiffness.   Eyes: Negative for pain, redness and visual disturbance.  Respiratory: Negative for shortness of breath and wheezing.   Cardiovascular: Negative for chest pain and palpitations.  Gastrointestinal: Negative for diarrhea, blood in stool, abdominal distention or other pain Genitourinary: Negative for hematuria, flank pain or change in urine volume.  Musculoskeletal: Negative for myalgias and joint swelling.  Skin: Negative for color change and wound.  Neurological: Negative for syncope and numbness. other than noted Hematological: Negative for adenopathy.  Psychiatric/Behavioral: Negative for hallucinations, self-injury, decreased concentration and agitation.      Objective:   Physical Exam BP 114/80  Pulse 88  Temp(Src) 99 F (37.2 C) (Oral)  Ht 5\' 8"  (1.727 m)  Wt 237 lb (107.502 kg)  BMI 36.04 kg/m2  SpO2 96% VS noted,  Constitutional: Pt is oriented to person, place, and time. Appears well-developed and well-nourished.  Head: Normocephalic and atraumatic.  Right Ear: External ear normal.  Left Ear: External ear normal.  Nose: Nose normal.  Mouth/Throat: Oropharynx is clear and moist.  Eyes: Conjunctivae and EOM are normal. Pupils are equal, round, and reactive to light.  Neck: Normal range of motion. Neck supple. No JVD present. No tracheal  deviation present.  Cardiovascular: Normal rate, regular rhythm, normal heart sounds and intact distal pulses.   Pulmonary/Chest: Effort normal and breath sounds normal.  Abdominal: Soft. Bowel sounds are normal. There is no tenderness. No HSM  Musculoskeletal: Normal range of motion. Exhibits no edema.  Lymphadenopathy:  Has no cervical adenopathy.  Neurological: Pt is alert and oriented to person, place, and time. Pt has normal reflexes. No cranial nerve deficit.  Skin: Skin is warm and dry. No rash noted.  Psychiatric:  Has  normal mood and affect. Behavior is normal.     Assessment & Plan:

## 2013-03-27 NOTE — Addendum Note (Signed)
Addended by: Corwin Levins on: 03/27/2013 02:15 PM   Modules accepted: Orders

## 2013-07-24 ENCOUNTER — Telehealth: Payer: Self-pay | Admitting: Internal Medicine

## 2013-07-24 NOTE — Telephone Encounter (Signed)
acutally this sounds much like a gastroenterology issues (stomach adn esophagus)  Please consider OV

## 2013-07-24 NOTE — Telephone Encounter (Signed)
Patient informed and did schedule appointment with PCP.

## 2013-07-24 NOTE — Telephone Encounter (Signed)
Pt request referral for cardiology due to lump in his throat and chest discomfort ( from time to time). Please advise.

## 2013-07-30 ENCOUNTER — Ambulatory Visit (INDEPENDENT_AMBULATORY_CARE_PROVIDER_SITE_OTHER): Payer: BC Managed Care – PPO | Admitting: Internal Medicine

## 2013-07-30 ENCOUNTER — Ambulatory Visit (INDEPENDENT_AMBULATORY_CARE_PROVIDER_SITE_OTHER)
Admission: RE | Admit: 2013-07-30 | Discharge: 2013-07-30 | Disposition: A | Discharge status: Home / Self Care | Payer: BC Managed Care – PPO | Source: Ambulatory Visit | Attending: Internal Medicine | Admitting: Internal Medicine

## 2013-07-30 ENCOUNTER — Encounter: Payer: Self-pay | Admitting: Internal Medicine

## 2013-07-30 VITALS — BP 120/80 | HR 86 | Temp 98.3°F | Ht 68.0 in | Wt 250.2 lb

## 2013-07-30 DIAGNOSIS — R079 Chest pain, unspecified: Secondary | ICD-10-CM

## 2013-07-30 DIAGNOSIS — F3289 Other specified depressive episodes: Secondary | ICD-10-CM

## 2013-07-30 DIAGNOSIS — F329 Major depressive disorder, single episode, unspecified: Secondary | ICD-10-CM

## 2013-07-30 DIAGNOSIS — E785 Hyperlipidemia, unspecified: Secondary | ICD-10-CM

## 2013-07-30 NOTE — Assessment & Plan Note (Signed)
stable overall by history and exam, recent data reviewed with pt, and pt to continue medical treatment as before,  to f/u any worsening symptoms or concerns Lab Results  Component Value Date   LDLCALC 109* 03/27/2013

## 2013-07-30 NOTE — Patient Instructions (Signed)
Please continue all other medications as before, and refills have been done if requested. Please have the pharmacy call with any other refills you may need.  Please go to the XRAY Department in the Basement (go straight as you get off the elevator) for the x-ray testing You will be contacted by phone if any changes need to be made immediately.  Otherwise, you will receive a letter about your results with an explanation, but please check with MyChart first.  You will be contacted regarding the referral for: stress testing  If testing is negative and the pain recurs, you may need to see GI

## 2013-07-30 NOTE — Assessment & Plan Note (Signed)
stable overall by history and exam, recent data reviewed with pt, and pt to continue medical treatment as before,  to f/u any worsening symptoms or concerns Lab Results  Component Value Date   WBC 6.5 03/27/2013   HGB 14.5 03/27/2013   HCT 42.4 03/27/2013   PLT 271.0 03/27/2013   GLUCOSE 86 03/27/2013   CHOL 168 03/27/2013   TRIG 78.0 03/27/2013   HDL 43.30 03/27/2013   LDLCALC 109* 03/27/2013   ALT 20 03/27/2013   AST 17 03/27/2013   NA 140 03/27/2013   K 4.6 03/27/2013   CL 107 03/27/2013   CREATININE 1.1 03/27/2013   BUN 19 03/27/2013   CO2 26 03/27/2013   TSH 3.67 03/27/2013   PSA 0.53 03/27/2013

## 2013-07-30 NOTE — Assessment & Plan Note (Addendum)
ECG reviewed as per emr, atypical, ? Etiology, possibly GI but given age, obesity, male, hyperlipidemia -  would further eval with stress testing,

## 2013-07-30 NOTE — Progress Notes (Signed)
Pre visit review using our clinic review tool, if applicable. No additional management support is needed unless otherwise documented below in the visit note. 

## 2013-07-30 NOTE — Progress Notes (Signed)
   Subjective:    Patient ID: Terry Warren, male    DOB: Aug 19, 1965, 48 y.o.   MRN: [id number]  HPI  Here to f/u with wife who mentions friend with MI at 48yo;  Pt with ongoing recurrent reflux despite daily PPI and zantac bid, though also tends to eat poorly as well.  Did have episode 3 days ago of 4 hrs of low SSCP, dull, pressure like without radiation, n/v, palp, sob, diaphoresis or dizziness.  Nothing seemed to make better or worse, did not try further antacid,  Seemed to resolve and not recurred. Nonpositional, nonpleuritic, was assoc with a fullness or lump feeling in throat at neck level.  No n/v, recent wt loss, other abd pain or blood.   Denies worsening depressive symptoms, suicidal ideation, or panic; Pt denies other chest pain, increased sob or doe, wheezing, orthopnea, PND, increased LE swelling, palpitations, dizziness or syncope. Past Medical History  Diagnosis Date  . ALLERGIC RHINITIS 01/08/2007  . BACK PAIN 10/18/2007  . CHEST PAIN 04/16/2008  . DEPRESSION 01/08/2007  . GERD 01/08/2007  . HYPERLIPIDEMIA 01/08/2007  . LOW BACK PAIN 01/08/2007  . MIGRAINE HEADACHE 01/08/2007  . OBESITY, MORBID 01/08/2007  . PSORIASIS 01/08/2007  . Diverticulitis    Past Surgical History  Procedure Laterality Date  . Cholecystectomy      reports that he has quit smoking. He does not have any smokeless tobacco history on file. He reports that he does not drink alcohol or use illicit drugs. family history includes Cancer in his father, mother, and other; Heart failure in his father and mother. Allergies  Allergen Reactions  . Penicillins    Current Outpatient Prescriptions on File Prior to Visit  Medication Sig Dispense Refill  . pantoprazole (PROTONIX) 40 MG tablet Take 1 tablet (40 mg total) by mouth daily.  90 tablet  3  . ranitidine (ZANTAC) 25 MG effervescent tablet Take 25 mg by mouth daily.         No current facility-administered medications on file prior to visit.   Review of  Systems  Constitutional: Negative for unexpected weight change, or unusual diaphoresis  HENT: Negative for tinnitus.   Eyes: Negative for photophobia and visual disturbance.  Respiratory: Negative for choking and stridor.   Gastrointestinal: Negative for vomiting and blood in stool.  Genitourinary: Negative for hematuria and decreased urine volume.  Musculoskeletal: Negative for acute joint swelling Skin: Negative for color change and wound.  Neurological: Negative for tremors and numbness other than noted  Psychiatric/Behavioral: Negative for decreased concentration or  hyperactivity.       Objective:   Physical Exam BP 120/80  Pulse 86  Temp(Src) 98.3 F (36.8 C) (Oral)  Ht 5\' 8"  (1.727 m)  Wt 250 lb 4 oz (113.513 kg)  BMI 38.06 kg/m2  SpO2 97% VS noted,  Constitutional: Pt appears well-developed and well-nourished.  HENT: Head: NCAT.  Right Ear: External ear normal.  Left Ear: External ear normal.  Eyes: Conjunctivae and EOM are normal. Pupils are equal, round, and reactive to light.  Neck: Normal range of motion. Neck supple.  Cardiovascular: Normal rate and regular rhythm.   Pulmonary/Chest: Effort normal and breath sounds normal.  Abd:  Soft, NT, non-distended, + BS Neurological: Pt is alert. Not confused  Skin: Skin is warm. No erythema.  Psychiatric: Pt behavior is normal. Thought content normal. not depressed affect     Assessment & Plan:

## 2013-07-31 ENCOUNTER — Encounter: Payer: Self-pay | Admitting: Internal Medicine

## 2013-08-13 ENCOUNTER — Encounter (HOSPITAL_COMMUNITY): Payer: BC Managed Care – PPO

## 2013-09-02 ENCOUNTER — Ambulatory Visit (HOSPITAL_COMMUNITY): Payer: BC Managed Care – PPO | Attending: Cardiovascular Disease | Admitting: Radiology

## 2013-09-02 VITALS — BP 126/84 | HR 60 | Ht 68.0 in | Wt 244.0 lb

## 2013-09-02 DIAGNOSIS — R0789 Other chest pain: Secondary | ICD-10-CM | POA: Insufficient documentation

## 2013-09-02 DIAGNOSIS — R42 Dizziness and giddiness: Secondary | ICD-10-CM | POA: Insufficient documentation

## 2013-09-02 DIAGNOSIS — R079 Chest pain, unspecified: Secondary | ICD-10-CM

## 2013-09-02 DIAGNOSIS — Z8249 Family history of ischemic heart disease and other diseases of the circulatory system: Secondary | ICD-10-CM | POA: Insufficient documentation

## 2013-09-02 MED ORDER — TECHNETIUM TC 99M SESTAMIBI GENERIC - CARDIOLITE
10.0000 | Freq: Once | INTRAVENOUS | Status: AC | PRN
  Administered 2013-09-02: 10 via INTRAVENOUS

## 2013-09-02 MED ORDER — TECHNETIUM TC 99M SESTAMIBI GENERIC - CARDIOLITE
30.0000 | Freq: Once | INTRAVENOUS | Status: AC | PRN
  Administered 2013-09-02: 30 via INTRAVENOUS

## 2013-09-02 NOTE — Progress Notes (Signed)
[facility] SITE 3 NUCLEAR MED [address] [place] [place], [place] [address] [phone]    Cardiology Nuclear Med Study  Terry Warren is a 48 y.o. male     MRN : [id number]     DOB: [birth year]  Procedure Date: 09/02/2013  Nuclear Med Background Indication for Stress Test:  Evaluation for Ischemia History:  No known CAD, Echo, GXT 2009, Cardiac CT 2009 (negative) Cardiac Risk Factors: Family History - CAD, History of Smoking and Lipids  Symptoms:  Chest Pain (last date of chest discomfort was two weeks ago) and Light-Headedness   Nuclear Pre-Procedure Caffeine/Decaff Intake:  None >12 hrs NPO After: 9:00pm   Lungs:  clear O2 Sat: 95% on room air. IV 0.9% NS with Angio Cath:  22g  IV Site: R Antecubital x 1,tolerated well IV Started by:  Irean HongPatsy Edwards, RN  Chest Size (in):  48 Cup Size: n/a  Height: 5\' 8"  (1.727 m)  Weight:  244 lb (110.678 kg)  BMI:  Body mass index is 37.11 kg/(m^2). Tech Comments:  N/A    Nuclear Med Study 1 or 2 day study: 1 day  Stress Test Type:  Stress  Reading MD: N/A  Order Authorizing Provider:  Oliver BarreJames John, MD  Resting Radionuclide: Technetium 10568m Sestamibi  Resting Radionuclide Dose: 11.0 mCi   Stress Radionuclide:  Technetium 8068m Sestamibi  Stress Radionuclide Dose: 33.0 mCi           Stress Protocol Rest HR: 60 Stress HR: 148  Rest BP: 126/84 Stress BP: 133/71  Exercise Time (min): 8:00 METS: 10.1           Dose of Adenosine (mg):  n/a Dose of Lexiscan: n/a mg  Dose of Atropine (mg): n/a Dose of Dobutamine: n/a mcg/kg/min (at max HR)  Stress Test Technologist: Nelson ChimesSharon Brooks, BS-ES  Nuclear Technologist:  Doyne Keelonya Yount, CNMT     Rest Procedure:  Myocardial perfusion imaging was performed at rest 45 minutes following the intravenous administration of Technetium 2268m Sestamibi. Rest ECG: NSR - Normal EKG  Stress Procedure:  The patient exercised on the treadmill utilizing the Bruce Protocol for 8:00 minutes. The patient stopped due  to fatigue and denied any chest pain.  Technetium 8268m Sestamibi was injected at peak exercise and myocardial perfusion imaging was performed after a brief delay. Stress ECG: No significant change from baseline ECG  QPS Raw Data Images:  Normal; no motion artifact; normal heart/lung ratio. Stress Images:  There is decreased uptake in the apex. which is likely artifact. Rest Images:  There is decreased uptake in the apex. which is likely artifact. Subtraction (SDS):  No evidence of ischemia. Transient Ischemic Dilatation (Normal <1.22):  1.00 Lung/Heart Ratio (Normal <0.45):  0.46  Quantitative Gated Spect Images QGS EDV:  114 ml QGS ESV:  49 ml  Impression Exercise Capacity:  Good exercise capacity. BP Response:  Hypotensive blood pressure response. Clinical Symptoms:  No significant symptoms noted. ECG Impression:  No significant ST segment change suggestive of ischemia. Duke Treadmill score +9. Comparison with Prior Nuclear Study: No previous nuclear study performed  Overall Impression:  Normal stress nuclear study.  LV Ejection Fraction: 57%.  LV Wall Motion:  NL LV Function; NL Wall Motion  Terry NoseKenneth C. Elicia Lui, MD, [facility] Board Certified in Nuclear Cardiology Attending Cardiologist [facility]

## 2013-09-03 ENCOUNTER — Encounter: Payer: Self-pay | Admitting: Internal Medicine

## 2014-02-14 ENCOUNTER — Ambulatory Visit (INDEPENDENT_AMBULATORY_CARE_PROVIDER_SITE_OTHER): Payer: BC Managed Care – PPO | Admitting: Internal Medicine

## 2014-04-02 ENCOUNTER — Ambulatory Visit: Payer: BC Managed Care – PPO | Admitting: Internal Medicine

## 2014-06-21 IMAGING — CR DG CHEST 2V
2 series · 2 of 2 positions shown · non-contrast
Comparison: none

[view not recorded (1 of 2)]
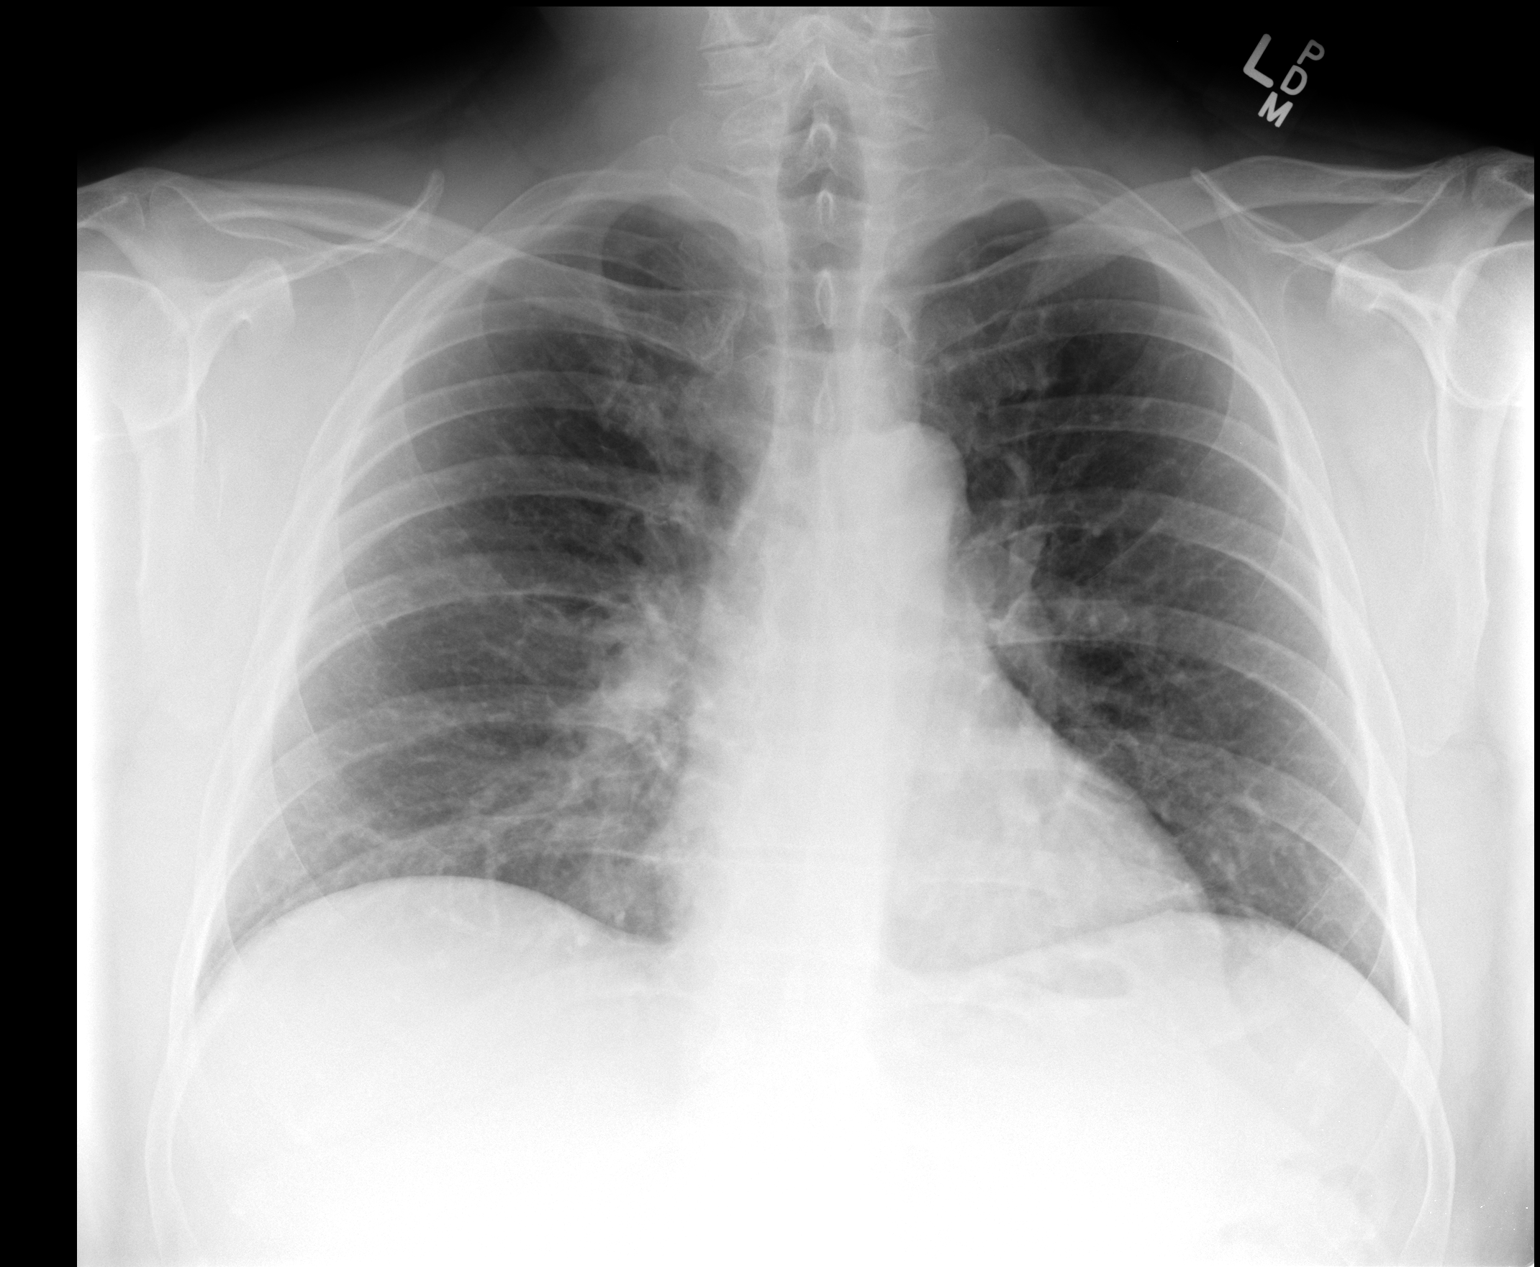

[view not recorded (2 of 2)]
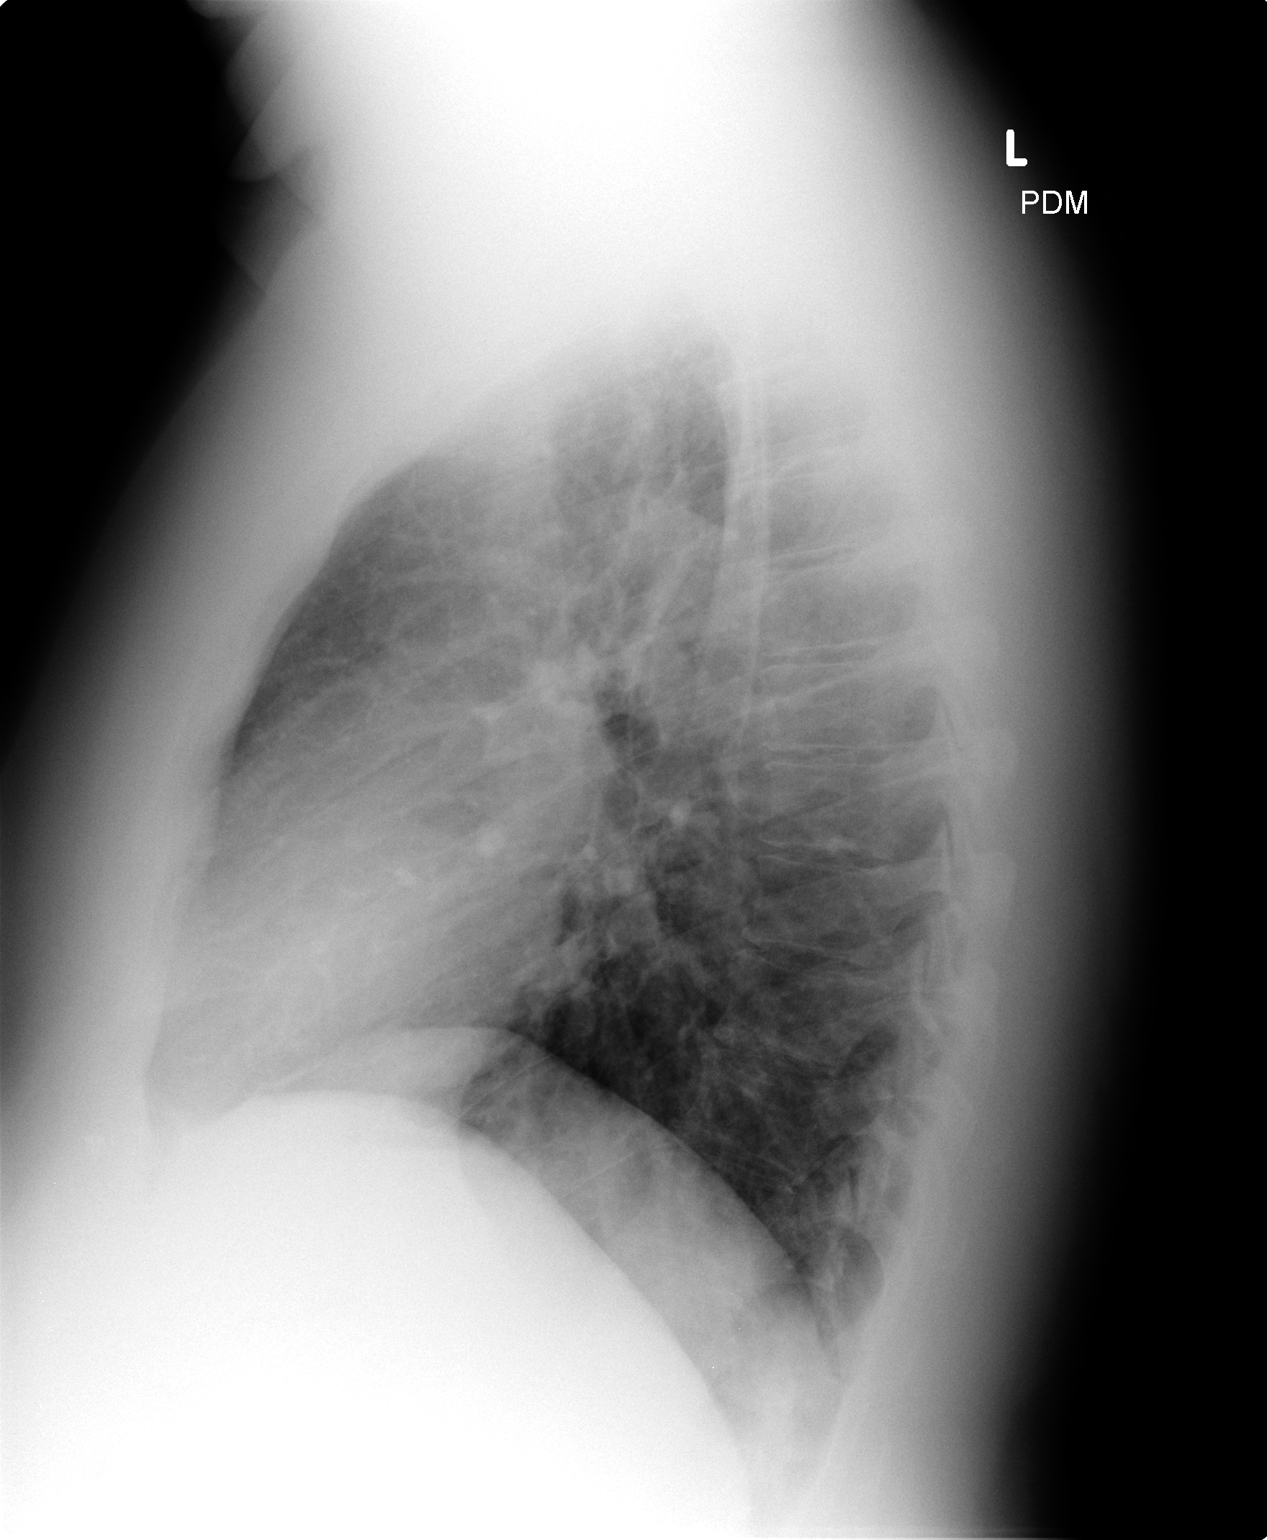

[2 of 2 positions shown; findings below may reference images not displayed]

CLINICAL DATA
Chest pain

EXAM
CHEST  2 VIEW

COMPARISON
None.

FINDINGS
Lungs are essentially clear. No focal consolidation. No pleural
effusion or pneumothorax.

The heart is normal in size.

Mild degenerative changes of the visualized thoracolumbar spine.

IMPRESSION
No evidence of acute cardiopulmonary disease.

SIGNATURE

## 2016-07-30 ENCOUNTER — Other Ambulatory Visit: Payer: Self-pay | Admitting: Emergency Medicine

## 2016-07-30 ENCOUNTER — Encounter: Payer: Self-pay | Admitting: Emergency Medicine

## 2016-07-30 ENCOUNTER — Emergency Department
Admission: EM | Admit: 2016-07-30 | Discharge: 2016-07-30 | Disposition: A | Discharge status: Home / Self Care | Payer: Self-pay | Source: Home / Self Care | Attending: Family Medicine | Admitting: Family Medicine

## 2016-07-30 DIAGNOSIS — R112 Nausea with vomiting, unspecified: Secondary | ICD-10-CM

## 2016-07-30 DIAGNOSIS — R197 Diarrhea, unspecified: Secondary | ICD-10-CM

## 2016-07-30 LAB — POCT CBC W AUTO DIFF (K'VILLE URGENT CARE)

## 2016-07-30 LAB — COMPLETE METABOLIC PANEL WITH GFR
ALT: 36 U/L (ref 9–46)
AST: 19 U/L (ref 10–35)
Albumin: 4.4 g/dL (ref 3.6–5.1)
Alkaline Phosphatase: 68 U/L (ref 40–115)
BUN: 24 mg/dL (ref 7–25)
CO2: 15 mmol/L — ABNORMAL LOW (ref 20–31)
Calcium: 9.1 mg/dL (ref 8.6–10.3)
Chloride: 111 mmol/L — ABNORMAL HIGH (ref 98–110)
Creat: 1.08 mg/dL (ref 0.70–1.33)
GFR, Est African American: >89 mL/min (ref >=60)
GFR, Est Non African American: 79 mL/min (ref >=60)
Glucose, Bld: 128 mg/dL — ABNORMAL HIGH (ref 65–99)
Potassium: 3.8 mmol/L (ref 3.5–5.3)
Sodium: 138 mmol/L (ref 135–146)
Total Bilirubin: 0.5 mg/dL (ref 0.2–1.2)
Total Protein: 7.2 g/dL (ref 6.1–8.1)

## 2016-07-30 MED ORDER — CIPROFLOXACIN HCL 500 MG PO TABS: 500.0000 mg | ORAL_TABLET | Freq: Two times a day (BID) | ORAL | 0 refills | Status: DC

## 2016-07-30 NOTE — Discharge Instructions (Signed)
°  You have been prescribed an antibiotic to help with diarrhea in case your symptoms are caused by a bacteria.   Please take the full course of antibiotics, even if you start to feel better to help prevent symptoms from coming back.  If you develop a rash, body aches or joint pain, difficulty breathing, or any other side effects you believe related to the antibiotic, please stop taking immediately and be evaluated by a medical provider.  You will be called about the stool culture results, even if normal.  These tests can take several days.  If you have not heard back by the end of the week, you may call our office to check on the results to see if they are still in process.   Please try to stay well hydrated and stick to a bland diet such as bread, rice, apple sauce, and toast.  Avoid milk, fried foods or greasy foods such as pizza and cheese burgers until at least 24-48 hours of no symptoms as these foods can make your symptoms worse.

## 2016-07-30 NOTE — ED Notes (Signed)
Obtained blood for CBC and CMP

## 2016-07-30 NOTE — ED Triage Notes (Signed)
[facility]O diarrhea 6 to 7 episodes per day since Monday, vomited only twice during this time. 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

## 2016-07-30 NOTE — ED Provider Notes (Signed)
CSN: [id number]     Arrival date & time 07/30/16  1306 History   First MD Initiated Contact with Patient 07/30/16 1346     Chief Complaint  Patient presents with  . Diarrhea   (Consider location/radiation/quality/duration/timing/severity/associated sxs/prior Treatment) HPI  Terry Warren is a 51 y.o. male presenting to UC with c/o 6-7 episodes of watery diarrhea for about 5-6 days. Denies blood or mucous in stool. He has had associated nausea and vomiting but no vomiting today.  He was seen by his PCP 3 days ago, advised it was a viral illness and prescribed zofran, advised to f/u for labs if not improving.  Mild intermittent abdominal cramping. Denies fever or chills. He has his appendix but had his gallbladder removed already.  Denies urinary symptoms. He was able to keep down some pancakes and Gatorade today. No sick contacts, recent travel or recent antibiotic use.   Past Medical History:  Diagnosis Date  . ALLERGIC RHINITIS 01/08/2007  . BACK PAIN 10/18/2007  . CHEST PAIN 04/16/2008  . DEPRESSION 01/08/2007  . Diverticulitis   . GERD 01/08/2007  . HYPERLIPIDEMIA 01/08/2007  . LOW BACK PAIN 01/08/2007  . MIGRAINE HEADACHE 01/08/2007  . OBESITY, MORBID 01/08/2007  . PSORIASIS 01/08/2007   Past Surgical History:  Procedure Laterality Date  . CHOLECYSTECTOMY     Family History  Problem Relation Age of Onset  . Cancer Other     breast cancer  . Heart failure Mother   . Cancer Mother     breast  . Cancer Father     prostate  . Heart failure Father    Social History  Substance Use Topics  . Smoking status: Former Games developermoker  . Smokeless tobacco: Never Used  . Alcohol use No    Review of Systems  Constitutional: Negative for chills and fever.  HENT: Negative for congestion, ear pain, sore throat, trouble swallowing and voice change.   Respiratory: Negative for cough and shortness of breath.   Cardiovascular: Negative for chest pain and palpitations.  Gastrointestinal: Positive  for abdominal pain, diarrhea and vomiting. Negative for blood in stool, constipation and nausea.  Musculoskeletal: Negative for arthralgias, back pain and myalgias.  Skin: Negative for rash.    Allergies  Penicillins  Home Medications   Prior to Admission medications   Medication Sig Start Date End Date Taking? Authorizing Provider  amphetamine-dextroamphetamine (ADDERALL XR) 15 MG 24 hr capsule Take 15 mg by mouth every morning.   Yes Historical Provider, MD  ciprofloxacin (CIPRO) 500 MG tablet Take 1 tablet (500 mg total) by mouth 2 (two) times daily. One po bid x 5 days 07/30/16   Junius FinnerErin O'Malley, PA-C  pantoprazole (PROTONIX) 40 MG tablet Take 1 tablet (40 mg total) by mouth daily. 03/27/13   Corwin LevinsJames W John, MD  ranitidine (ZANTAC) 25 MG effervescent tablet Take 25 mg by mouth daily.      Historical Provider, MD   Meds Ordered and Administered this Visit  Medications - No data to display  BP 118/83 (BP Location: Left Arm)   Pulse 100   Temp 98.1 F (36.7 C) (Oral)   Resp 16   Ht 5\' 8"  (1.727 m)   Wt 257 lb 8 oz (116.8 kg)   SpO2 95%   BMI 39.15 kg/m  No data found.   Physical Exam  Constitutional: He is oriented to person, place, and time. He appears well-developed and well-nourished. No distress.  HENT:  Head: Normocephalic and atraumatic.  Mouth/Throat: Oropharynx is  clear and moist.  Eyes: EOM are normal.  Neck: Normal range of motion.  Cardiovascular: Normal rate and regular rhythm.   Pulmonary/Chest: Effort normal and breath sounds normal. No respiratory distress. He has no wheezes. He has no rales.  Abdominal: Soft. He exhibits no distension and no mass. There is tenderness. There is no rebound and no guarding.  Soft, non-distended. Diffuse tenderness, worse from mid to right side of abdomen w/o rebound or guarding.  Musculoskeletal: Normal range of motion.  Neurological: He is alert and oriented to person, place, and time.  Skin: Skin is warm and dry. He is not  diaphoretic.  Psychiatric: He has a normal mood and affect. His behavior is normal.  Nursing note and vitals reviewed.   Urgent Care Course     Procedures (including critical care time)  Labs Review Labs Reviewed  C DIFFICILE QUICK SCREEN W PCR REFLEX  GASTROINTESTINAL PANEL BY PCR, STOOL (REPLACES STOOL CULTURE)  COMPLETE METABOLIC PANEL WITH GFR  POCT CBC W AUTO DIFF ([facility])    Imaging Review No results found.    MDM   1. Nausea vomiting and diarrhea    Due to duration of symptoms, presume infectious diarrhea. GI panel PCR and C. Diff stool samples sent to lab  Will treat empirically with Cipro 500mg  BID for 5 days while labs pending F/u with PCP next week as needed. Discussed symptoms that warrant emergent care in the ED.     Junius Finner, PA-C 07/30/16 [phone]

## 2016-07-31 ENCOUNTER — Telehealth: Payer: Self-pay | Admitting: Emergency Medicine

## 2016-07-31 NOTE — Telephone Encounter (Signed)
Spoke with patient and told him  Terry FinnerErin O'Malley, PA-C  [name], RN        His labs look okay overall. His glucose may be elevated due to drinking Gatorade for his current diarrhea. It is recommended he have his blood work rechecked in about 1 week by his PCP who will likely order fasting blood work to help determine if additional monitoring or treatment of elevated sugar is needed.

## 2016-08-02 NOTE — Telephone Encounter (Signed)
LMOM advising patient it will be at least 48 hours before remainder of labs return.

## 2016-08-03 LAB — CLOSTRIDIUM DIFFICILE BY PCR: Toxigenic C. Difficile by PCR: NOT DETECTED

## 2016-08-04 LAB — GASTROINTESTINAL PATHOGEN PANEL PCR
C. difficile Tox A/B, PCR: NOT DETECTED
Campylobacter, PCR: NOT DETECTED
Cryptosporidium, PCR: NOT DETECTED
E coli (ETEC) LT/ST PCR: NOT DETECTED
E coli (STEC) stx1/stx2, PCR: NOT DETECTED
E coli 0157, PCR: NOT DETECTED
Giardia lamblia, PCR: NOT DETECTED
Norovirus, PCR: NOT DETECTED
Rotavirus A, PCR: NOT DETECTED
Salmonella, PCR: NOT DETECTED
Shigella, PCR: NOT DETECTED

## 2016-08-05 ENCOUNTER — Telehealth: Payer: Self-pay | Admitting: *Deleted

## 2016-08-05 NOTE — Telephone Encounter (Signed)
Callback: LMOM advising patient all labs normal. Call back as needed.

## 2016-08-05 NOTE — Telephone Encounter (Signed)
Patient returned call. Lab results given and discussed. He is feeling much better.

## 2019-04-06 ENCOUNTER — Other Ambulatory Visit: Payer: Self-pay

## 2019-04-06 ENCOUNTER — Emergency Department (INDEPENDENT_AMBULATORY_CARE_PROVIDER_SITE_OTHER): Payer: BC Managed Care – PPO

## 2019-04-06 ENCOUNTER — Emergency Department
Admission: EM | Admit: 2019-04-06 | Discharge: 2019-04-06 | Disposition: A | Discharge status: Home / Self Care | Payer: BC Managed Care – PPO | Source: Home / Self Care

## 2019-04-06 ENCOUNTER — Encounter: Payer: Self-pay | Admitting: Emergency Medicine

## 2019-04-06 DIAGNOSIS — M25511 Pain in right shoulder: Secondary | ICD-10-CM

## 2019-04-06 DIAGNOSIS — M898X1 Other specified disorders of bone, shoulder: Secondary | ICD-10-CM

## 2019-04-06 NOTE — Discharge Instructions (Signed)
°  You may alternate cool and warm compresses such as a heating pad or warm damp washcloth and take Tylenol and Motrin as needed for pain and swelling.  Please call to schedule a follow up appointment with Sports Medicine or your family medicine provider for recheck of symptoms later this week if not improving.
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
## 2021-05-01 ENCOUNTER — Emergency Department (INDEPENDENT_AMBULATORY_CARE_PROVIDER_SITE_OTHER)
Admission: EM | Admit: 2021-05-01 | Discharge: 2021-05-01 | Disposition: A | Discharge status: Home / Self Care | Payer: BC Managed Care – PPO | Source: Home / Self Care | Attending: Family Medicine | Admitting: Family Medicine

## 2021-05-01 ENCOUNTER — Other Ambulatory Visit: Payer: Self-pay

## 2021-05-01 DIAGNOSIS — J069 Acute upper respiratory infection, unspecified: Secondary | ICD-10-CM

## 2021-05-01 MED ORDER — AZITHROMYCIN 250 MG PO TABS: ORAL_TABLET | ORAL | 0 refills | Status: DC

## 2021-05-01 NOTE — ED Provider Notes (Signed)
[facility]    CSN: [id number] Arrival date & time: 05/01/21  1022      History   Chief Complaint Chief Complaint  Patient presents with   Cough    HPI Terry Warren is a 55 y.o. male.   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  Currently has a cold for about the last 5 days, 6 days.  He has sinus congestion and pressure, postnasal drip, scratchy throat.  Cough and chest congestion.  Clear mucus.  No chest pain or shortness of breath.  Chills but no fever.  No body aches or headache.  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 Authorizing Provider  azithromycin (ZITHROMAX Z-PAK) 250 MG tablet Take 2 pills on the first day, then 1 a day thereafter until gone 05/01/21  Yes Eustace Moore, MD  amphetamine-dextroamphetamine (ADDERALL XR) 15 MG 24 hr capsule Take 15 mg by mouth every morning.    [provider]  pantoprazole (PROTONIX) 40 MG tablet Take 1 tablet (40 mg total) by mouth daily. 03/27/13   Corwin Levins, MD  ranitidine (ZANTAC) 25 MG effervescent tablet  Take 25 mg by mouth daily.      [provider]    Family History Family History  Problem Relation Age of Onset   Cancer Other        breast cancer   Heart failure Mother    Cancer Mother        breast   Cancer Father        prostate   Heart failure Father     Social History Social History   Tobacco Use   Smoking status: Former   Smokeless tobacco: Never  Substance Use Topics   Alcohol use: No   Drug use: No     Allergies   Penicillins   Review of Systems Review of Systems See HPI  Physical Exam Triage Vital Signs ED Triage Vitals  Enc Vitals Group     BP 05/01/21 1045 138/89     Pulse Rate 05/01/21 1045 73     Resp 05/01/21 1045 18     Temp 05/01/21 1045 98.7 F (37.1 C)     Temp Source 05/01/21 1045 Oral     SpO2 05/01/21 1045 98 %     Weight --      Height --      Head Circumference --      Peak Flow --      Pain Score 05/01/21 1047 4  Pain Loc --      Pain Edu? --      Excl. in [place]? --    No data found.  Updated Vital Signs BP 138/89 (BP Location: Left Arm)   Pulse 73   Temp 98.7 F (37.1 C) (Oral)   Resp 18   SpO2 98%      Physical Exam Constitutional:      General: He is not in acute distress.    Appearance: He is well-developed. He is obese.  HENT:     Head: Normocephalic and atraumatic.     Right Ear: Tympanic membrane and ear canal normal.     Left Ear: Tympanic membrane and ear canal normal.     Nose: Congestion and rhinorrhea present.     Mouth/Throat:     Pharynx: No posterior oropharyngeal erythema.  Eyes:     Conjunctiva/sclera: Conjunctivae normal.     Pupils: Pupils are equal, round, and reactive to light.  Cardiovascular:     Rate and Rhythm: Normal rate and regular rhythm.     Heart sounds: Normal heart sounds.  Pulmonary:     Effort: Pulmonary effort is normal. No respiratory distress.     Breath sounds: Normal breath sounds.  Abdominal:     General: There is no distension.     Palpations: Abdomen  is soft.  Musculoskeletal:        General: Normal range of motion.     Cervical back: Normal range of motion.  Lymphadenopathy:     Cervical: Cervical adenopathy present.  Skin:    General: Skin is warm and dry.  Neurological:     Mental Status: He is alert.  Psychiatric:        Mood and Affect: Mood normal.        Behavior: Behavior normal.     UC Treatments / Results  Labs (all labs ordered are listed, but only abnormal results are displayed) Labs Reviewed - No data to display  EKG   Radiology No results found.  Procedures Procedures (including critical care time)  Medications Ordered in UC Medications - No data to display  Initial Impression / Assessment and Plan / UC Course  I have reviewed the triage vital signs and the nursing notes.  Pertinent labs & imaging results that were available during my care of the patient were reviewed by me and considered in my medical decision making (see chart for details).     Reviewed by our lower respiratory infections.  Need for antibiotics only if he fails to improve by 8 to 10 days.  Patient is given a written prescription to fill and take if he feels worse.  Otherwise we will continue with symptomatic care Final Clinical Impressions(s) / UC Diagnoses   Final diagnoses:  Viral URI with cough     Discharge Instructions      Use Flonase 2 times a day for the first 3 days then once a day until symptoms have improved Take Mucinex DM 2 times a day for cough and congestion Fill and take the antibiotic if you do not see improvement over the next 2 to 3 days Make sure you are drinking lots of water.  Run a humidifier in your bedroom if there is one available     ED Prescriptions     Medication Sig Dispense Auth. Provider   azithromycin (ZITHROMAX Z-PAK) 250 MG tablet Take 2 pills on the first day, then 1 a day thereafter until gone 6 tablet Rica Mast  Fannie Knee, MD      PDMP not reviewed this encounter.   Eustace Moore, MD 05/01/21 [phone]

## 2021-05-01 NOTE — ED Triage Notes (Signed)
Pt present productive cough with sore throat. Symptom started a week ago. Pt tried otc medication with no relief.

## 2021-05-01 NOTE — Discharge Instructions (Signed)
Use Flonase 2 times a day for the first 3 days then once a day until symptoms have improved Take Mucinex DM 2 times a day for cough and congestion Fill and take the antibiotic if you do not see improvement over the next 2 to 3 days Make sure you are drinking lots of water.  Run a humidifier in your bedroom if there is one available

## 2022-11-02 DIAGNOSIS — Z0289 Encounter for other administrative examinations: Secondary | ICD-10-CM

## 2022-11-03 ENCOUNTER — Ambulatory Visit (INDEPENDENT_AMBULATORY_CARE_PROVIDER_SITE_OTHER): Payer: BC Managed Care – PPO | Admitting: Physician Assistant

## 2022-11-03 ENCOUNTER — Encounter (INDEPENDENT_AMBULATORY_CARE_PROVIDER_SITE_OTHER): Payer: Self-pay | Admitting: Physician Assistant

## 2022-11-03 VITALS — Ht 67.5 in | Wt 291.0 lb

## 2022-11-03 DIAGNOSIS — I10 Essential (primary) hypertension: Secondary | ICD-10-CM | POA: Diagnosis not present

## 2022-11-03 DIAGNOSIS — K760 Fatty (change of) liver, not elsewhere classified: Secondary | ICD-10-CM

## 2022-11-03 DIAGNOSIS — E669 Obesity, unspecified: Secondary | ICD-10-CM | POA: Diagnosis not present

## 2022-11-03 DIAGNOSIS — Z6841 Body Mass Index (BMI) 40.0 and over, adult: Secondary | ICD-10-CM

## 2022-11-03 NOTE — Progress Notes (Signed)
Office: [phone]  /  Fax: [phone]   Initial Visit  Terry Warren was seen in clinic today to evaluate for obesity. He is interested in losing weight to improve overall health and reduce the risk of weight related complications. He presents today to review program treatment options, initial physical assessment, and evaluation.     He was referred by: Friend or Family His girlfriend is patient   When asked what else they would like to accomplish? He states: Adopt healthier eating patterns, Improve energy levels and physical activity, Improve existing medical conditions, Improve quality of life, Improve appearance, and Other: "wants to get down to 10% body fat"   Weight history: Notes weight has really increased over the past year.   When asked how has your weight affected you? He states: Contributed to medical problems, Contributed to orthopedic problems or mobility issues, Having fatigue, Having poor endurance, and Has affected mood   Some associated conditions: Hypertension, Fatty liver disease, OSA, GERD, and Other: Hypothyrodism  Contributing factors: Family history, Nutritional, Reduced physical activity, and Eating patterns Strong family hx of CAD.   Weight promoting medications identified: None  Current nutrition plan: High-protein and Other: High fiber  Current level of physical activity: Walking and Other: cycling  Current or previous pharmacotherapy: None  Response to medication: Never tried medications   Past medical history includes:   Past Medical History:  Diagnosis Date   ALLERGIC RHINITIS 01/08/2007   BACK PAIN 10/18/2007   CHEST PAIN 04/16/2008   DEPRESSION 01/08/2007   Diverticulitis    GERD 01/08/2007   HYPERLIPIDEMIA 01/08/2007   LOW BACK PAIN 01/08/2007   MIGRAINE HEADACHE 01/08/2007   OBESITY, MORBID 01/08/2007   PSORIASIS 01/08/2007     Objective:   Ht 5' 7.5" (1.715 m)   Wt 291 lb (132 kg)   BMI 44.90 kg/m  He was weighed on the  bioimpedance scale: Body mass index is 44.9 kg/m.  Peak Weight:300 , Body Fat%:36.6, Visceral Fat Rating:25, Weight trend over the last 12 months: Increasing  General:  Alert, oriented and cooperative. Patient is in no acute distress.  Respiratory: Normal respiratory effort, no problems with respiration noted   Gait: able to ambulate independently  Mental Status: Normal mood and affect. Normal behavior. Normal judgment and thought content.   DIAGNOSTIC DATA REVIEWED:  BMET    Component Value Date/Time   NA 138 07/30/2016 1432   K 3.8 07/30/2016 1432   CL 111 (H) 07/30/2016 1432   CO2 15 (L) 07/30/2016 1432   GLUCOSE 128 (H) 07/30/2016 1432   BUN 24 07/30/2016 1432   CREATININE 1.08 07/30/2016 1432   CALCIUM 9.1 07/30/2016 1432   GFRNONAA 79 07/30/2016 1432   GFRAA >89 07/30/2016 1432   No results found for: "HGBA1C" No results found for: "INSULIN" CBC    Component Value Date/Time   WBC 6.5 03/27/2013 1418   RBC 4.88 03/27/2013 1418   HGB 14.5 03/27/2013 1418   HCT 42.4 03/27/2013 1418   PLT 271.0 03/27/2013 1418   MCV 86.9 03/27/2013 1418   MCHC 34.2 03/27/2013 1418   RDW 13.5 03/27/2013 1418   Iron/TIBC/Ferritin/ %Sat No results found for: "IRON", "TIBC", "FERRITIN", "IRONPCTSAT" Lipid Panel     Component Value Date/Time   CHOL 168 03/27/2013 1418   TRIG 78.0 03/27/2013 1418   HDL 43.30 03/27/2013 1418   CHOLHDL 4 03/27/2013 1418   VLDL 15.6 03/27/2013 1418   LDLCALC 109 (H) 03/27/2013 1418   Hepatic Function Panel  Component Value Date/Time   PROT 7.2 07/30/2016 1432   ALBUMIN 4.4 07/30/2016 1432   AST 19 07/30/2016 1432   ALT 36 07/30/2016 1432   ALKPHOS 68 07/30/2016 1432   BILITOT 0.5 07/30/2016 1432   BILIDIR 0.1 03/27/2013 1418      Component Value Date/Time   TSH 3.67 03/27/2013 1418     Assessment and Plan:   NAFLD (nonalcoholic fatty liver disease)  Primary hypertension  Hypertension Hypertension control uncertain and needs  further observation.  Medication(s): Reports has been on medications in past, lisinopril 10 mg daily.   BP Readings from Last 3 Encounters:  05/01/21 138/89  04/06/19 135/83  07/30/16 118/83   Lab Results  Component Value Date   CREATININE 1.08 07/30/2016   CREATININE 1.1 03/27/2013   Lab Results  Component Value Date   GFR 76.03 03/27/2013   Plan: Will obtain baseline labs at new patient visit and baseline EKG.  Goal BP of 130/80 with or without medications is desired.   MAFLD:  Patient reports he has been told he has fatty liver disease.  Denies use of supplements, heavy alcohol consumption or steatogenic medications.    Plan: Will plan to obtain labs at new patient visit  and follow up on test results. Losing 15 % of body may lower risk. It is also recommended that he avoid processed foods, simple sugars, heavy alcohol consumption and steatogenic medications. He may also be a candidate for incretin therapy if not cost prohibitive.        Obesity Treatment / Action Plan:  Patient will work on garnering support from family and friends to begin weight loss journey. Will work on eliminating or reducing the presence of highly palatable, calorie dense foods in the home. Will complete provided nutritional and psychosocial assessment questionnaire before the next appointment. Will be scheduled for indirect calorimetry to determine resting energy expenditure in a fasting state.  This will allow [place] to create a reduced calorie, high-protein meal plan to promote loss of fat mass while preserving muscle mass. Will think about ideas on how to incorporate physical activity into their daily routine. Will work on reducing intake of added sugars, simple sugars and processed carbs. Will avoid skipping meals which may result in increased hunger signals and overeating at certain times. Counseled on the health benefits of losing 5%-15% of total body weight. Was counseled on nutritional approaches  to weight loss and benefits of reducing processed foods and consuming plant-based foods and high quality protein as part of nutritional weight management. Was counseled on pharmacotherapy and role as an adjunct in weight management.   Obesity Education Performed Today:  He was weighed on the bioimpedance scale and results were discussed and documented in the synopsis. He will return to the [place] office for fasting labs, IC, and EKG.   We discussed obesity as a disease and the importance of a more detailed evaluation of all the factors contributing to the disease.  We discussed the importance of long term lifestyle changes which include nutrition, exercise and behavioral modifications as well as the importance of customizing this to his specific health and social needs.  We discussed the benefits of reaching a healthier weight to alleviate the symptoms of existing conditions and reduce the risks of the biomechanical, metabolic and psychological effects of obesity.  Andris Baumann appears to be in the action stage of change and states they are ready to start intensive lifestyle modifications and behavioral modifications.  30 minutes was spent  today on this visit including the above counseling, pre-visit chart review, and post-visit documentation.  Reviewed by clinician on day of visit: allergies, medications, problem list, medical history, surgical history, family history, social history, and previous encounter notes pertinent to obesity diagnosis.  The patient lives in [place] and would like to attend appointments in the [place] office but did not want to wait for this appointment to be seen.   Maki Sweetser,PA-C

## 2022-11-10 ENCOUNTER — Encounter: Payer: Self-pay | Admitting: Bariatrics

## 2022-11-10 ENCOUNTER — Ambulatory Visit: Payer: BC Managed Care – PPO | Admitting: Bariatrics

## 2022-11-10 VITALS — BP 139/81 | HR 80 | Temp 98.4°F | Ht 67.5 in | Wt 289.0 lb

## 2022-11-10 DIAGNOSIS — G4733 Obstructive sleep apnea (adult) (pediatric): Secondary | ICD-10-CM

## 2022-11-10 DIAGNOSIS — Z1331 Encounter for screening for depression: Secondary | ICD-10-CM

## 2022-11-10 DIAGNOSIS — I10 Essential (primary) hypertension: Secondary | ICD-10-CM

## 2022-11-10 DIAGNOSIS — E559 Vitamin D deficiency, unspecified: Secondary | ICD-10-CM

## 2022-11-10 DIAGNOSIS — K760 Fatty (change of) liver, not elsewhere classified: Secondary | ICD-10-CM | POA: Diagnosis not present

## 2022-11-10 DIAGNOSIS — Z0289 Encounter for other administrative examinations: Secondary | ICD-10-CM

## 2022-11-10 DIAGNOSIS — R5383 Other fatigue: Secondary | ICD-10-CM | POA: Diagnosis not present

## 2022-11-10 DIAGNOSIS — R0602 Shortness of breath: Secondary | ICD-10-CM | POA: Diagnosis not present

## 2022-11-10 DIAGNOSIS — Z6841 Body Mass Index (BMI) 40.0 and over, adult: Secondary | ICD-10-CM

## 2022-11-10 DIAGNOSIS — Z833 Family history of diabetes mellitus: Secondary | ICD-10-CM

## 2022-11-11 LAB — LIPID PANEL WITH LDL/HDL RATIO
Cholesterol, Total: 190 mg/dL (ref 100–199)
HDL: 40 mg/dL (ref >39)
LDL Chol Calc (NIH): 129 mg/dL — ABNORMAL HIGH (ref 0–99)
LDL/HDL Ratio: 3.2 ratio (ref 0.0–3.6)
Triglycerides: 115 mg/dL (ref 0–149)
VLDL Cholesterol Cal: 21 mg/dL (ref 5–40)

## 2022-11-11 LAB — COMPREHENSIVE METABOLIC PANEL
ALT: 72 IU/L — ABNORMAL HIGH (ref 0–44)
AST: 39 IU/L (ref 0–40)
Albumin: 4.4 g/dL (ref 3.8–4.9)
Alkaline Phosphatase: 85 IU/L (ref 44–121)
BUN/Creatinine Ratio: 20 (ref 9–20)
BUN: 20 mg/dL (ref 6–24)
Bilirubin Total: 0.4 mg/dL (ref 0.0–1.2)
CO2: 19 mmol/L — ABNORMAL LOW (ref 20–29)
Calcium: 9.3 mg/dL (ref 8.7–10.2)
Chloride: 108 mmol/L — ABNORMAL HIGH (ref 96–106)
Creatinine, Ser: 0.99 mg/dL (ref 0.76–1.27)
Globulin, Total: 2.6 g/dL (ref 1.5–4.5)
Glucose: 100 mg/dL — ABNORMAL HIGH (ref 70–99)
Potassium: 4.6 mmol/L (ref 3.5–5.2)
Sodium: 141 mmol/L (ref 134–144)
Total Protein: 7 g/dL (ref 6.0–8.5)
eGFR: 89 mL/min/{1.73_m2} (ref >59)

## 2022-11-11 LAB — CBC WITH DIFFERENTIAL/PLATELET
Basophils Absolute: 0 10*3/uL (ref 0.0–0.2)
Basos: 1 %
EOS (ABSOLUTE): 0.1 10*3/uL (ref 0.0–0.4)
Eos: 1 %
Hematocrit: 44.7 % (ref 37.5–51.0)
Hemoglobin: 15.3 g/dL (ref 13.0–17.7)
Immature Grans (Abs): 0 10*3/uL (ref 0.0–0.1)
Immature Granulocytes: 1 %
Lymphocytes Absolute: 2.1 10*3/uL (ref 0.7–3.1)
Lymphs: 35 %
MCH: 30.1 pg (ref 26.6–33.0)
MCHC: 34.2 g/dL (ref 31.5–35.7)
MCV: 88 fL (ref 79–97)
Monocytes Absolute: 0.4 10*3/uL (ref 0.1–0.9)
Monocytes: 6 %
Neutrophils Absolute: 3.4 10*3/uL (ref 1.4–7.0)
Neutrophils: 56 %
Platelets: 179 10*3/uL (ref 150–450)
RBC: 5.09 x10E6/uL (ref 4.14–5.80)
RDW: 12.9 % (ref 11.6–15.4)
WBC: 6 10*3/uL (ref 3.4–10.8)

## 2022-11-11 LAB — HEMOGLOBIN A1C
Est. average glucose Bld gHb Est-mCnc: 120 mg/dL
Hgb A1c MFr Bld: 5.8 % — ABNORMAL HIGH (ref 4.8–5.6)

## 2022-11-11 LAB — INSULIN, RANDOM: INSULIN: 57.2 u[IU]/mL — ABNORMAL HIGH (ref 2.6–24.9)

## 2022-11-11 LAB — VITAMIN B12: Vitamin B-12: 419 pg/mL (ref 232–1245)

## 2022-11-11 LAB — VITAMIN D 25 HYDROXY (VIT D DEFICIENCY, FRACTURES): Vit D, 25-Hydroxy: 32.2 ng/mL (ref 30.0–100.0)

## 2022-11-14 ENCOUNTER — Encounter (INDEPENDENT_AMBULATORY_CARE_PROVIDER_SITE_OTHER): Payer: Self-pay | Admitting: Bariatrics

## 2022-11-14 DIAGNOSIS — R7303 Prediabetes: Secondary | ICD-10-CM | POA: Insufficient documentation

## 2022-11-14 DIAGNOSIS — E88819 Insulin resistance, unspecified: Secondary | ICD-10-CM | POA: Insufficient documentation

## 2022-11-14 DIAGNOSIS — R7401 Elevation of levels of liver transaminase levels: Secondary | ICD-10-CM | POA: Insufficient documentation

## 2022-11-15 NOTE — Progress Notes (Signed)
Chief Complaint:   OBESITY Terry Warren (MR# [id number]) is a 57 y.o. male who presents for evaluation and treatment of obesity and related comorbidities. Current BMI is Body mass index is 44.6 kg/m. Pierce has been struggling with his weight for many years and has been unsuccessful in either losing weight, maintaining weight loss, or reaching his healthy weight goal.  Mohan is currently in the action stage of change and ready to dedicate time achieving and maintaining a healthier weight. Rodriques is interested in becoming our patient and working on intensive lifestyle modifications including (but not limited to) diet and exercise for weight loss.  Patient is here for his initial visit.  He states that he likes to cook.  Teddie's habits were reviewed today and are as follows: His family eats meals together, he thinks his family will eat healthier with him, his desired weight loss is 94 lbs, he has been heavy most of his life, his heaviest weight ever was 300 pounds, he has significant food cravings issues, he is frequently drinking liquids with calories, he frequently eats larger portions than normal, and he struggles with emotional eating.  Depression Screen Hermilo's Food and Mood (modified PHQ-9) score was 7.  Subjective:   1. Other fatigue Dennis admits to daytime somnolence and denies waking up still tired. Patient has a history of symptoms of daytime fatigue. Obediah generally gets 6 or 8 hours of sleep per night, and states that he has generally restful sleep. Snoring is present. Apneic episodes are present. Epworth Sleepiness Score is 7.   2. SOB (shortness of breath) on exertion Allex notes increasing shortness of breath with exercising and seems to be worsening over time with weight gain. He notes getting out of breath sooner with activity than he used to. This has not gotten worse recently. Wael denies shortness of breath at rest or orthopnea.  3. NAFLD (nonalcoholic fatty liver  disease) Patient has no elevated liver enzymes.  4. Primary hypertension Patient is not on medications at this time.  His blood pressure is slightly elevated.  His blood pressure ranges at 130's/80's at home.  5. OSA (obstructive sleep apnea) Patient is using CPAP.  6. Family history of diabetes mellitus Patient has a history of elevated glucose.  7. Vitamin D deficiency Patient is not on vitamin D supplements.  Assessment/Plan:   1. Other fatigue Joffre does feel that his weight is causing his energy to be lower than it should be. Fatigue may be related to obesity, depression or many other causes. Labs will be ordered, and in the meanwhile, Lorance will focus on self care including making healthy food choices, increasing physical activity and focusing on stress reduction.  - CBC with Differential/Platelet  2. SOB (shortness of breath) on exertion Tyran does feel that he gets out of breath more easily that he used to when he exercises. Cortavius's shortness of breath appears to be obesity related and exercise induced. He has agreed to work on weight loss and gradually increase exercise to treat his exercise induced shortness of breath. Will continue to monitor closely.  - CBC with Differential/Platelet  3. NAFLD (nonalcoholic fatty liver disease) We will check labs today, and we will follow-up at patient's next visit.  - Hemoglobin A1c - Insulin, random - Vitamin B12 - Lipid Panel With LDL/HDL Ratio - VITAMIN D 25 Hydroxy (Vit-D Deficiency, Fractures) - Comprehensive metabolic panel  4. Primary hypertension We will check labs today.  Patient is to continue to  check his blood pressure at home.  - Hemoglobin A1c - Insulin, random - Vitamin B12 - Lipid Panel With LDL/HDL Ratio - VITAMIN D 25 Hydroxy (Vit-D Deficiency, Fractures) - Comprehensive metabolic panel - CBC with Differential/Platelet  5. OSA (obstructive sleep apnea) Patient will continue to use his CPAP.  6. Family  history of diabetes mellitus We will check labs today, and we will follow-up at patient's next visit.  - Hemoglobin A1c - Insulin, random  7. Vitamin D deficiency We will check labs today, and we will follow-up at patient's next visit.  - VITAMIN D 25 Hydroxy (Vit-D Deficiency, Fractures)  8. Depression screening Kong had a positive depression screening. Depression is commonly associated with obesity and often results in emotional eating behaviors. We will monitor this closely and work on CBT to help improve the non-hunger eating patterns. Referral to Psychology may be required if no improvement is seen as he continues in our clinic.  9. Morbid obesity (HCC) - Hemoglobin A1c - Insulin, random - Vitamin B12 - Lipid Panel With LDL/HDL Ratio - VITAMIN D 25 Hydroxy (Vit-D Deficiency, Fractures) - Comprehensive metabolic panel - CBC with Differential/Platelet  10. BMI 40.0-44.9, adult [facility]) Eura is currently in the action stage of change and his goal is to continue with weight loss efforts. I recommend Wentworth begin the structured treatment plan as follows:  He has agreed to the Category 4 Plan and keeping a food journal and adhering to recommended goals of 2000 calories and 150 grams of protein.  Meal planning and intentional eating were discussed.  Patient is to work on decreasing portion size.  Review labs with the patient from 09/15/2022, CMP and TSH.  Exercise goals: Riding a bike and HIIT workout.  Behavioral modification strategies: increasing lean protein intake, decreasing simple carbohydrates, increasing vegetables, increasing water intake, decreasing eating out, no skipping meals, meal planning and cooking strategies, keeping healthy foods in the home, and planning for success.  He was informed of the importance of frequent follow-up visits to maximize his success with intensive lifestyle modifications for his multiple health conditions. He was informed we would discuss his lab  results at his next visit unless there is a critical issue that needs to be addressed sooner. Antwian agreed to keep his next visit at the agreed upon time to discuss these results.  Objective:   Blood pressure 139/81, pulse 80, temperature 98.4 F (36.9 C), height 5' 7.5" (1.715 m), weight 289 lb (131.1 kg), SpO2 95 %. Body mass index is 44.6 kg/m.  EKG: Normal sinus rhythm, rate (unable to obtain).  Indirect Calorimeter completed today shows a VO2 of 387 and a REE of 2678.  His calculated basal metabolic rate is 3536 thus his basal metabolic rate is better than expected.  General: Cooperative, alert, well developed, in no acute distress. HEENT: Conjunctivae and lids unremarkable. Cardiovascular: Regular rhythm.  Lungs: Normal work of breathing. Neurologic: No focal deficits.   Lab Results  Component Value Date   CREATININE 0.99 11/10/2022   BUN 20 11/10/2022   NA 141 11/10/2022   K 4.6 11/10/2022   CL 108 (H) 11/10/2022   CO2 19 (L) 11/10/2022   Lab Results  Component Value Date   ALT 72 (H) 11/10/2022   AST 39 11/10/2022   ALKPHOS 85 11/10/2022   BILITOT 0.4 11/10/2022   Lab Results  Component Value Date   HGBA1C 5.8 (H) 11/10/2022   Lab Results  Component Value Date   INSULIN 57.2 (H) 11/10/2022  Lab Results  Component Value Date   TSH 3.67 03/27/2013   Lab Results  Component Value Date   CHOL 190 11/10/2022   HDL 40 11/10/2022   LDLCALC 129 (H) 11/10/2022   TRIG 115 11/10/2022   CHOLHDL 4 03/27/2013   Lab Results  Component Value Date   WBC 6.0 11/10/2022   HGB 15.3 11/10/2022   HCT 44.7 11/10/2022   MCV 88 11/10/2022   PLT 179 11/10/2022   No results found for: "IRON", "TIBC", "FERRITIN"  Attestation Statements:   Reviewed by clinician on day of visit: allergies, medications, problem list, medical history, surgical history, family history, social history, and previous encounter notes.   Trude Mcburney, am acting as Energy manager for [employer], DO.  I have reviewed the above documentation for accuracy and completeness, and I agree with the above. Corinna Capra, DO

## 2022-11-28 ENCOUNTER — Ambulatory Visit: Payer: BC Managed Care – PPO | Admitting: Bariatrics

## 2022-12-06 ENCOUNTER — Ambulatory Visit: Payer: BC Managed Care – PPO | Admitting: Bariatrics

## 2022-12-08 ENCOUNTER — Ambulatory Visit: Payer: BC Managed Care – PPO | Admitting: Bariatrics

## 2022-12-08 VITALS — BP 127/85 | HR 75 | Temp 97.9°F | Ht 67.5 in | Wt 280.0 lb

## 2022-12-08 DIAGNOSIS — Z6841 Body Mass Index (BMI) 40.0 and over, adult: Secondary | ICD-10-CM

## 2022-12-08 DIAGNOSIS — K760 Fatty (change of) liver, not elsewhere classified: Secondary | ICD-10-CM | POA: Diagnosis not present

## 2022-12-08 DIAGNOSIS — E559 Vitamin D deficiency, unspecified: Secondary | ICD-10-CM | POA: Diagnosis not present

## 2022-12-08 DIAGNOSIS — R7303 Prediabetes: Secondary | ICD-10-CM | POA: Diagnosis not present

## 2022-12-08 MED ORDER — VITAMIN D (ERGOCALCIFEROL) 1.25 MG (50000 UNIT) PO CAPS: 50000.0000 [IU] | ORAL_CAPSULE | ORAL | 0 refills | Status: AC

## 2022-12-19 ENCOUNTER — Ambulatory Visit: Payer: BC Managed Care – PPO | Admitting: Bariatrics

## 2022-12-19 NOTE — Progress Notes (Unsigned)
Chief Complaint:   OBESITY Terry Warren is here to discuss his progress with his obesity treatment plan along with follow-up of his obesity related diagnoses. Terry Warren is on the Category 4 Plan and keeping a food journal and adhering to recommended goals of 2000 calories and 150 grams of protein and states he is following his eating plan approximately 50% of the time. Terry Warren states he is lifting weights for 40 minutes 7 times per week.  Today's visit was #: 2 Starting weight: 289 lbs Starting date: 11/10/2022 Today's weight: 280 lbs Today's date: 12/08/2022 Total lbs lost to date: 9 Total lbs lost since last in-office visit: 9  Interim History: Patient is down 9 pounds since his first visit.  He has been on vacation.  Subjective:   1. Pre-diabetes Patient is not on medications.  His recent A1c was 5.8 and insulin 57.2.  2. Vitamin D insufficiency Patient's recent vitamin D level was 32.2.  3. NAFLD (nonalcoholic fatty liver disease) Patient is not on medications.  His recent ALT was 72.  Assessment/Plan:   1. Pre-diabetes Patient is to work on decreasing all carbohydrates, and sugar and starches.  Handouts on insulin resistance and prediabetes were given to the patient.  2. Vitamin D insufficiency Patient agreed to start prescription vitamin D 50,000 IU once weekly with no refills.  Goal vitamin D level is 50.  - Vitamin D, Ergocalciferol, (DRISDOL) 1.25 MG (50000 UNIT) CAPS capsule; Take 1 capsule (50,000 Units total) by mouth every 7 (seven) days.  Dispense: 5 capsule; Refill: 0  3. NAFLD (nonalcoholic fatty liver disease) Patient will work on his meal plan and exercise.  4. Morbid obesity (HCC)  5. BMI 40.0-44.9, adult [facility]) Remond is currently in the action stage of change. As such, his goal is to continue with weight loss efforts. He has agreed to the Category 4 Plan.   Meal planning was discussed.  Review labs with the patient from 11/10/2022, CMP, lipids, vitamin D, B12,  CBC, A1c, glucose, and insulin.  Dining out guide and high protein food choices was given.    Exercise goals: As is. Patient will do his workout routine.  Behavioral modification strategies: increasing lean protein intake, decreasing simple carbohydrates, increasing vegetables, increasing water intake, decreasing eating out, no skipping meals, meal planning and cooking strategies, keeping healthy foods in the home, and planning for success.  Hildon has agreed to follow-up with our clinic in 2 weeks. He was informed of the importance of frequent follow-up visits to maximize his success with intensive lifestyle modifications for his multiple health conditions.   Objective:   Blood pressure 127/85, pulse 75, temperature 97.9 F (36.6 C), height 5' 7.5" (1.715 m), weight 280 lb (127 kg), SpO2 96%. Body mass index is 43.21 kg/m.  General: Cooperative, alert, well developed, in no acute distress. HEENT: Conjunctivae and lids unremarkable. Cardiovascular: Regular rhythm.  Lungs: Normal work of breathing. Neurologic: No focal deficits.   Lab Results  Component Value Date   CREATININE 0.99 11/10/2022   BUN 20 11/10/2022   NA 141 11/10/2022   K 4.6 11/10/2022   CL 108 (H) 11/10/2022   CO2 19 (L) 11/10/2022   Lab Results  Component Value Date   ALT 72 (H) 11/10/2022   AST 39 11/10/2022   ALKPHOS 85 11/10/2022   BILITOT 0.4 11/10/2022   Lab Results  Component Value Date   HGBA1C 5.8 (H) 11/10/2022   Lab Results  Component Value Date   INSULIN 57.2 (H)  11/10/2022   Lab Results  Component Value Date   TSH 3.67 03/27/2013   Lab Results  Component Value Date   CHOL 190 11/10/2022   HDL 40 11/10/2022   LDLCALC 129 (H) 11/10/2022   TRIG 115 11/10/2022   CHOLHDL 4 03/27/2013   Lab Results  Component Value Date   VD25OH 32.2 11/10/2022   Lab Results  Component Value Date   WBC 6.0 11/10/2022   HGB 15.3 11/10/2022   HCT 44.7 11/10/2022   MCV 88 11/10/2022   PLT 179  11/10/2022   No results found for: "IRON", "TIBC", "FERRITIN"  Attestation Statements:   Reviewed by clinician on day of visit: allergies, medications, problem list, medical history, surgical history, family history, social history, and previous encounter notes.   Trude Mcburney, am acting as Energy manager for [employer], DO.  I have reviewed the above documentation for accuracy and completeness, and I agree with the above. Corinna Capra, DO

## 2022-12-27 ENCOUNTER — Encounter: Payer: Self-pay | Admitting: Bariatrics

## 2022-12-27 ENCOUNTER — Ambulatory Visit: Payer: BC Managed Care – PPO | Admitting: Bariatrics

## 2022-12-27 VITALS — BP 141/80 | HR 79 | Temp 97.9°F | Ht 67.5 in | Wt 278.0 lb

## 2022-12-27 DIAGNOSIS — Z6841 Body Mass Index (BMI) 40.0 and over, adult: Secondary | ICD-10-CM

## 2022-12-27 DIAGNOSIS — R7303 Prediabetes: Secondary | ICD-10-CM | POA: Diagnosis not present

## 2022-12-27 NOTE — Progress Notes (Signed)
   WEIGHT SUMMARY AND BIOMETRICS  Weight Lost Since Last Visit: 2lb  Vitals Temp: 97.9 F (36.6 C) BP: (!) 141/80 Pulse Rate: 79 SpO2: 97 %   Anthropometric Measurements Height: 5' 7.5" (1.715 m) Weight: 278 lb (126.1 kg) BMI (Calculated): 42.87 Weight at Last Visit: 280lb Weight Lost Since Last Visit: 2lb Starting Weight: 289lb   Body Composition  Body Fat %: 36.4 % Fat Mass (lbs): 101.4 lbs Muscle Mass (lbs): 168.4 lbs Total Body Water (lbs): 118.6 lbs Visceral Fat Rating : 24   Other Clinical Data Fasting: yes Labs: no Today's Visit #: 3 Starting Date: 11/10/22    OBESITY Terry Warren is here to discuss his progress with his obesity treatment plan along with follow-up of his obesity related diagnoses.     Nutrition Plan: the Category 4 plan - 50% adherence.  Current exercise: walking  Interim History:  He is down 2 lbs since his last visit.  Eating all of the food on the plan., Is not skipping meals, and Water intake is adequate.   Hunger is moderately controlled.  Cravings are moderately controlled.  Assessment/Plan:   Prediabetes Last A1c was 5.8  Medication(s): none  Lab Results  Component Value Date   HGBA1C 5.8 (H) 11/10/2022   Lab Results  Component Value Date   INSULIN 57.2 (H) 11/10/2022    Plan:  Will minimize all refined carbohydrates both sweets and starches.  Will keep protein, water, and fiber intake high.  Increase Polyunsaturated and Monounsaturated fats to increase satiety and encourage weight loss.  He will continue his exercise regimen.(both aerobic and resistance training).     Morbid Obesity: Current BMI BMI (Calculated): 42.87    Terry Warren is currently in the action stage of change. As such, his goal is to continue with weight loss efforts.  He has agreed to the Category 4 plan.He will continue to keep his protein high and increase healthy fats.   Exercise goals: All adults should avoid inactivity. Some physical  activity is better than none, and adults who participate in any amount of physical activity gain some health benefits. [facility] [employer] [facility].   Behavioral modification strategies: increasing lean protein intake, no meal skipping, better snacking choices, planning for success, increasing fiber rich foods, keep healthy foods in the home, and mindful eating.  Terry Warren has agreed to follow-up with our clinic in 2 weeks.     Medications Discontinued During This Encounter  Medication Reason   amphetamine-dextroamphetamine (ADDERALL XR) 15 MG 24 hr capsule Patient Preference        Objective:   VITALS: Per patient if applicable, see vitals. GENERAL: Alert and in no acute distress. CARDIOPULMONARY: No increased WOB. Speaking in clear sentences.  PSYCH: Pleasant and cooperative. Speech normal rate and rhythm. Affect is appropriate. Insight and judgement are appropriate. Attention is focused, linear, and appropriate.  NEURO: Oriented as arrived to appointment on time with no prompting.   Attestation Statements:    This was prepared with the assistance of Engineer, civil (consulting).  Occasional wrong-word or sound-a-like substitutions may have occurred due to the inherent limitations of voice recognition software.   Corinna Capra, DO

## 2023-01-25 ENCOUNTER — Ambulatory Visit: Payer: BC Managed Care – PPO | Admitting: Bariatrics
